# Patient Record
Sex: Male | Born: 1990
Health system: Southern US, Community
[De-identification: ages and names within clinical notes are randomized; demographics above are authoritative.]

## PROBLEM LIST (undated history)

## (undated) ENCOUNTER — Encounter: Attending: Nephrology | Primary: Nephrology

## (undated) ENCOUNTER — Encounter

## (undated) ENCOUNTER — Ambulatory Visit
Attending: Student in an Organized Health Care Education/Training Program | Primary: Student in an Organized Health Care Education/Training Program

## (undated) ENCOUNTER — Telehealth

## (undated) ENCOUNTER — Encounter
Attending: Student in an Organized Health Care Education/Training Program | Primary: Student in an Organized Health Care Education/Training Program

## (undated) ENCOUNTER — Ambulatory Visit: Payer: PRIVATE HEALTH INSURANCE

## (undated) ENCOUNTER — Ambulatory Visit

## (undated) ENCOUNTER — Ambulatory Visit: Payer: PRIVATE HEALTH INSURANCE | Attending: Nephrology | Primary: Nephrology

## (undated) ENCOUNTER — Encounter: Payer: PRIVATE HEALTH INSURANCE | Attending: Surgery | Primary: Surgery

## (undated) ENCOUNTER — Telehealth: Attending: Nephrology | Primary: Nephrology

## (undated) ENCOUNTER — Telehealth: Attending: Family | Primary: Family

## (undated) ENCOUNTER — Encounter: Attending: Neurological Surgery | Primary: Neurological Surgery

## (undated) ENCOUNTER — Encounter: Attending: Family | Primary: Family

## (undated) ENCOUNTER — Encounter: Attending: Surgery | Primary: Surgery

## (undated) ENCOUNTER — Telehealth: Attending: Pharmacist | Primary: Pharmacist

## (undated) ENCOUNTER — Telehealth: Payer: PRIVATE HEALTH INSURANCE | Attending: Nephrology | Primary: Nephrology

## (undated) ENCOUNTER — Encounter: Attending: Ambulatory Care | Primary: Ambulatory Care

## (undated) DIAGNOSIS — N189 Chronic kidney disease, unspecified: Secondary | ICD-10-CM

## (undated) DIAGNOSIS — E039 Hypothyroidism, unspecified: Secondary | ICD-10-CM

## (undated) DIAGNOSIS — Z8619 Personal history of other infectious and parasitic diseases: Secondary | ICD-10-CM

## (undated) DIAGNOSIS — I10 Essential (primary) hypertension: Secondary | ICD-10-CM

## (undated) DIAGNOSIS — G935 Compression of brain: Secondary | ICD-10-CM

## (undated) DIAGNOSIS — Z940 Kidney transplant status: Secondary | ICD-10-CM

## (undated) DIAGNOSIS — R519 Headache, unspecified: Secondary | ICD-10-CM

## (undated) DIAGNOSIS — E7204 Cystinosis: Secondary | ICD-10-CM

## (undated) DIAGNOSIS — B029 Zoster without complications: Secondary | ICD-10-CM

## (undated) DIAGNOSIS — T7840XA Allergy, unspecified, initial encounter: Secondary | ICD-10-CM

## (undated) DIAGNOSIS — B079 Viral wart, unspecified: Secondary | ICD-10-CM

## (undated) HISTORY — DX: Compression of brain: G93.5

## (undated) HISTORY — DX: Zoster without complications: B02.9

## (undated) HISTORY — DX: Viral wart, unspecified: B07.9

## (undated) HISTORY — DX: Allergy, unspecified, initial encounter: T78.40XA

## (undated) HISTORY — DX: Chronic kidney disease, unspecified: N18.9

## (undated) HISTORY — DX: Hypothyroidism, unspecified: E03.9

## (undated) HISTORY — DX: Essential (primary) hypertension: I10

## (undated) HISTORY — DX: Cystinosis: E72.04

## (undated) HISTORY — DX: Kidney transplant status: Z94.0

## (undated) HISTORY — PX: MYRINGOTOMY WITH TUBE PLACEMENT: SHX5663

## (undated) HISTORY — DX: Personal history of other infectious and parasitic diseases: Z86.19

## (undated) HISTORY — DX: Headache, unspecified: R51.9

## (undated) MED ORDER — ASPIRIN 81 MG TABLET,DELAYED RELEASE: 81 mg | tablet | Freq: Every day | 0 refills | 30 days | Status: CN

---
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
## 2013-03-02 ENCOUNTER — Encounter: Payer: Self-pay | Admitting: Internal Medicine

## 2013-03-02 ENCOUNTER — Ambulatory Visit (INDEPENDENT_AMBULATORY_CARE_PROVIDER_SITE_OTHER): Payer: Managed Care, Other (non HMO) | Admitting: Internal Medicine

## 2013-03-02 VITALS — BP 110/70 | HR 84 | Temp 98.3°F | Ht 67.9 in | Wt 127.2 lb

## 2013-03-02 DIAGNOSIS — N189 Chronic kidney disease, unspecified: Secondary | ICD-10-CM

## 2013-03-02 DIAGNOSIS — E7200 Disorders of amino-acid transport, unspecified: Secondary | ICD-10-CM

## 2013-03-02 DIAGNOSIS — E7204 Cystinosis: Secondary | ICD-10-CM

## 2013-03-02 DIAGNOSIS — Z9109 Other allergy status, other than to drugs and biological substances: Secondary | ICD-10-CM

## 2013-03-06 ENCOUNTER — Encounter: Payer: Self-pay | Admitting: Internal Medicine

## 2013-03-06 DIAGNOSIS — N189 Chronic kidney disease, unspecified: Secondary | ICD-10-CM | POA: Insufficient documentation

## 2013-03-06 DIAGNOSIS — Z9109 Other allergy status, other than to drugs and biological substances: Secondary | ICD-10-CM | POA: Insufficient documentation

## 2013-03-06 DIAGNOSIS — E7204 Cystinosis: Secondary | ICD-10-CM | POA: Insufficient documentation

## 2013-03-06 NOTE — Assessment & Plan Note (Signed)
Followed by Dr Ralph Dowdy [facility] nephrology.  Obtain recent notes and labs.

## 2013-03-06 NOTE — Assessment & Plan Note (Signed)
Followed by [facility].

## 2013-03-06 NOTE — Assessment & Plan Note (Signed)
Controlled with claritin

## 2013-03-06 NOTE — Progress Notes (Signed)
  Subjective:    Patient ID: Joshua Atkinson, male    DOB: [birth year], 22 y.o.   MRN: [id number]  HPI 22 year old male with past history of cystinosis with resulting kidney disease who comes in today to establish care.  He is followed by his mother.  History obtained form both of them.  He is followed by Dr Ralph Dowdy at [facility].  Is active.  Enjoys being outdoors.  Just graduated from [employer].  Planning to start a new job next week.  Has some allergies.  Takes claritin. Controls.  Previously followed at [facility] ( Dr Horris Latino).  No cardiac symptoms with increased activity or exertion.  Breathing stable.  Bowels stable.  Previously on growth hormone.  Off now.     Past Medical History  Diagnosis Date  . Allergy   . History of chicken pox   . Chronic kidney disease   . Cystinosis     Outpatient Encounter Prescriptions as of 03/02/2013  Medication Sig Dispense Refill  . calcitRIOL (ROCALTROL) 1 MCG/ML solution Take 1 mcg by mouth daily.      . calcium carbonate (OS-CAL) 600 MG TABS Take 600 mg by mouth daily.      . Cysteamine Bitartrate (CYSTAGON PO) Take 300 mg by mouth 4 (four) times daily.      Marland Kitchen epoetin alfa (PROCRIT) 4000 UNIT/ML injection Inject 4,000 Units into the skin every 14 (fourteen) days.      . IRON, FERROUS GLUCONATE, PO Take 45 mg by mouth daily.      Marland Kitchen levothyroxine (SYNTHROID, LEVOTHROID) 50 MCG tablet Take 50 mcg by mouth daily before breakfast.      . loratadine (CLARITIN) 10 MG tablet Take 10 mg by mouth daily.      Marland Kitchen tricitrates (POLYCITRA-LC) [phone] MG/5ML SOLN Take 40 mLs by mouth 3 (three) times daily.       No facility-administered encounter medications on file as of 03/02/2013.   Review of Systems Patient denies any headache, lightheadedness or dizziness.  Allergy symptoms controlled with claritin.  No chest pain, tightness or palpitations.  No increased shortness of breath, cough or congestion.  No acid reflux.  No nausea or vomiting.  No abdominal  pain or cramping.  No bowel change, such as diarrhea, constipation, BRBPR or melana.  No urine change.        Objective:   Physical Exam Filed Vitals:   03/02/13 1010  BP: 110/70  Pulse: 84  Temp: 98.3 F (42.74 C)   22 year old male in no acute distress.  HEENT:  Nares - clear.  Oropharynx - without lesions. NECK:  Supple.  Nontender.  No audible carotid bruit.  HEART:  Appears to be regular.   LUNGS:  No crackles or wheezing audible.  Respirations even and unlabored.   RADIAL PULSE:  Equal bilaterally.  ABDOMEN:  Soft.  Nontender.  Bowel sounds present and normal.  No audible abdominal bruit.    EXTREMITIES:  No increased edema present.  DP pulses palpable and equal bilaterally.         Assessment & Plan:  HEALTH MAINTENANCE.  Obtain records from [facility] to review.

## 2013-07-06 ENCOUNTER — Emergency Department: Payer: Self-pay | Admitting: Emergency Medicine

## 2013-09-01 ENCOUNTER — Other Ambulatory Visit: Payer: Self-pay

## 2014-01-01 ENCOUNTER — Emergency Department: Payer: Self-pay | Admitting: Internal Medicine

## 2014-01-11 ENCOUNTER — Ambulatory Visit (INDEPENDENT_AMBULATORY_CARE_PROVIDER_SITE_OTHER): Payer: Managed Care, Other (non HMO) | Admitting: *Deleted

## 2014-01-11 DIAGNOSIS — Z111 Encounter for screening for respiratory tuberculosis: Secondary | ICD-10-CM

## 2014-01-13 ENCOUNTER — Telehealth: Payer: Self-pay | Admitting: *Deleted

## 2014-01-13 LAB — TB SKIN TEST
Induration: 0 mm
TB SKIN TEST: NEGATIVE

## 2014-01-13 NOTE — Telephone Encounter (Signed)
Pt in for PPD reading, negative results. Results faxed to [facility] per pt request [phone].

## 2014-03-03 ENCOUNTER — Encounter: Payer: Managed Care, Other (non HMO) | Admitting: Internal Medicine

## 2014-03-23 ENCOUNTER — Ambulatory Visit (INDEPENDENT_AMBULATORY_CARE_PROVIDER_SITE_OTHER): Payer: Managed Care, Other (non HMO) | Admitting: Internal Medicine

## 2014-03-23 ENCOUNTER — Encounter: Payer: Self-pay | Admitting: Internal Medicine

## 2014-03-23 VITALS — BP 104/60 | HR 70 | Temp 98.0°F | Resp 12 | Ht 67.75 in | Wt 130.5 lb

## 2014-03-23 DIAGNOSIS — E7200 Disorders of amino-acid transport, unspecified: Secondary | ICD-10-CM

## 2014-03-23 DIAGNOSIS — N189 Chronic kidney disease, unspecified: Secondary | ICD-10-CM

## 2014-03-23 DIAGNOSIS — E7204 Cystinosis: Secondary | ICD-10-CM

## 2014-03-23 DIAGNOSIS — Z9109 Other allergy status, other than to drugs and biological substances: Secondary | ICD-10-CM

## 2014-03-23 NOTE — Progress Notes (Signed)
Pre visit review using our clinic review tool, if applicable. No additional management support is needed unless otherwise documented below in the visit note. 

## 2014-03-23 NOTE — Progress Notes (Signed)
  Subjective:    Patient ID: Joshua Atkinson, male    DOB: [birth year], 23 y.o.   MRN: [id number]  HPI 23 year old male with past history of cystinosis with resulting kidney disease who comes in today for a physical exam.  He is followed by Dr Ralph Dowdy at [facility].  Is active.  Enjoys being outdoors and hunting.  Has some allergies.  Takes claritin. Controls.  No cardiac symptoms with increased activity or exertion.  Breathing stable.  Bowels stable.  Previously on growth hormone.  Off now.  Undergoing w/up for kidney transplant.     Past Medical History  Diagnosis Date  . Allergy   . History of chicken pox   . Chronic kidney disease   . Cystinosis     Outpatient Encounter Prescriptions as of 03/23/2014  Medication Sig  . calcitRIOL (ROCALTROL) 1 MCG/ML solution Take 1 mcg by mouth daily.  . calcium carbonate (OS-CAL) 600 MG TABS Take 600 mg by mouth daily.  . Cysteamine Bitartrate (CYSTAGON PO) Take 300 mg by mouth 4 (four) times daily.  Marland Kitchen epoetin alfa (PROCRIT) 4000 UNIT/ML injection Inject 4,000 Units into the skin every 14 (fourteen) days.  . IRON, FERROUS GLUCONATE, PO Take 45 mg by mouth daily.  Marland Kitchen levothyroxine (SYNTHROID, LEVOTHROID) 50 MCG tablet Take 50 mcg by mouth daily before breakfast.  . loratadine (CLARITIN) 10 MG tablet Take 10 mg by mouth daily.  Marland Kitchen tricitrates (POLYCITRA-LC) [phone] MG/5ML SOLN Take 40 mLs by mouth 3 (three) times daily.    Review of Systems Patient denies any headache, lightheadedness or dizziness.  Allergy symptoms controlled with claritin.  No chest pain, tightness or palpitations.  No increased shortness of breath, cough or congestion.  No acid reflux.  No nausea or vomiting.  No abdominal pain or cramping.  No bowel change, such as diarrhea, constipation, BRBPR or melana.  No urine change.       Objective:   Physical Exam  Filed Vitals:   03/23/14 1105  BP: 104/60  Pulse: 70  Temp: 98 F (36.7 C)  Resp: 32   23 year old male in no  acute distress.  HEENT:  Nares - clear.  Oropharynx - without lesions. NECK:  Supple.  Nontender.  No audible carotid bruit.  HEART:  Appears to be regular.   LUNGS:  No crackles or wheezing audible.  Respirations even and unlabored.   RADIAL PULSE:  Equal bilaterally.  ABDOMEN:  Soft.  Nontender.  Bowel sounds present and normal.  No audible abdominal bruit.  GU:  Normal descended testicles.  No palpable testicular nodules.   EXTREMITIES:  No increased edema present.  DP pulses palpable and equal bilaterally.         Assessment & Plan:  HEALTH MAINTENANCE.  Physical today.  Labs being followed by nephrology.    I spent 25 minutes with the patient and more than 50% of the time was spent in consultation regarding the above.

## 2014-03-26 ENCOUNTER — Encounter: Payer: Self-pay | Admitting: Internal Medicine

## 2014-03-26 NOTE — Assessment & Plan Note (Signed)
Followed by [facility].  Undergoing evaluation for kidney transplant.

## 2014-03-26 NOTE — Assessment & Plan Note (Signed)
Followed by Dr Ralph Dowdy [facility] nephrology.  Obtain recent notes and labs.

## 2014-03-26 NOTE — Assessment & Plan Note (Signed)
Controlled with claritin

## 2014-09-06 ENCOUNTER — Telehealth: Payer: Self-pay | Admitting: *Deleted

## 2014-09-06 NOTE — Telephone Encounter (Signed)
This message was received through mothers mychart: Dr. Nicki Reaper, I have a concern about my son, Joshua Atkinson. He has complained about back pain about mid-back for a few days. I don't know whether it is from pulling on something at work or possibly needing a new mattress or if his kidneys are involved. Would it be possible to see him and check blood work ? I hate to call [facility] Kidney transplant unless it seems it may be kidneys. We see his nephrologist again Dec. 9 but he is out of the country this month. Thanks, Joshua Atkinson

## 2014-09-06 NOTE — Telephone Encounter (Signed)
I can see him on 09/08/14 - 12:00.

## 2014-09-06 NOTE — Telephone Encounter (Signed)
Mother notified & appt scheduled

## 2014-09-08 ENCOUNTER — Encounter: Payer: Self-pay | Admitting: Internal Medicine

## 2014-09-08 ENCOUNTER — Ambulatory Visit (INDEPENDENT_AMBULATORY_CARE_PROVIDER_SITE_OTHER): Payer: Managed Care, Other (non HMO) | Admitting: Internal Medicine

## 2014-09-08 VITALS — BP 108/60 | HR 73 | Temp 98.7°F | Resp 14 | Ht 67.5 in | Wt 129.5 lb

## 2014-09-08 DIAGNOSIS — M545 Low back pain: Secondary | ICD-10-CM

## 2014-09-08 DIAGNOSIS — Z23 Encounter for immunization: Secondary | ICD-10-CM

## 2014-09-08 DIAGNOSIS — E7204 Cystinosis: Secondary | ICD-10-CM

## 2014-09-08 DIAGNOSIS — N184 Chronic kidney disease, stage 4 (severe): Secondary | ICD-10-CM

## 2014-09-08 NOTE — Progress Notes (Signed)
Pre visit review using our clinic review tool, if applicable. No additional management support is needed unless otherwise documented below in the visit note. 

## 2014-09-09 ENCOUNTER — Encounter: Payer: Self-pay | Admitting: Internal Medicine

## 2014-09-09 DIAGNOSIS — M549 Dorsalgia, unspecified: Secondary | ICD-10-CM | POA: Insufficient documentation

## 2014-09-09 NOTE — Progress Notes (Signed)
  Subjective:    Patient ID: Joshua Atkinson, male    DOB: [birth year], 23 y.o.   MRN: [id number]  Back Pain  23 year old male with past history of cystinosis with resulting kidney disease who comes in today as a work in with concerns regarding back pain.  He is followed by Dr Ralph Dowdy at [facility].  Is on the kidney transplant list.  Was worked in today for back pain.  Had called in a few days ago with low back pain.  Described the pain as constant.  No radiation down his legs.  No urine or bowel change.  No abdominal pain or cramping.  States the pain has resolved now.  No back pain.  Feels good.  Does a lot of physical activity at his job.  Denies any injury.  Again no back pain now.     Past Medical History  Diagnosis Date  . Allergy   . History of chicken pox   . Chronic kidney disease   . Cystinosis     Outpatient Encounter Prescriptions as of 09/08/2014  Medication Sig  . calcitRIOL (ROCALTROL) 1 MCG/ML solution Take 1 mcg by mouth daily.  . calcium carbonate (OS-CAL) 600 MG TABS Take 600 mg by mouth daily.  . Cysteamine Bitartrate (CYSTAGON PO) Take 300 mg by mouth 4 (four) times daily.  Marland Kitchen epoetin alfa (PROCRIT) 4000 UNIT/ML injection Inject 4,000 Units into the skin every 14 (fourteen) days.  . IRON, FERROUS GLUCONATE, PO Take 45 mg by mouth daily.  Marland Kitchen levothyroxine (SYNTHROID, LEVOTHROID) 50 MCG tablet Take 50 mcg by mouth daily before breakfast.  . loratadine (CLARITIN) 10 MG tablet Take 10 mg by mouth daily.  Marland Kitchen tricitrates (POLYCITRA-LC) [phone] MG/5ML SOLN Take 40 mLs by mouth 3 (three) times daily.    Review of Systems  Musculoskeletal: Positive for back pain.  No acid reflux.  No nausea or vomiting.  No abdominal pain or cramping.  No bowel change, such as diarrhea, constipation, BRBPR or melana.  No urine change.  Back pain has resolved.        Objective:   Physical Exam  Filed Vitals:   09/08/14 1205  BP: 108/60  Pulse: 73  Temp: 98.7 F (37.1 C)   Resp: 2   23 year old male in no acute distress.  NECK:  Supple.  Nontender.    HEART:  Appears to be regular.   LUNGS:  No crackles or wheezing audible.  Respirations even and unlabored.   RADIAL PULSE:  Equal bilaterally.  ABDOMEN:  Soft.  Nontender.  Bowel sounds present and normal.  No audible abdominal bruit.  BACK:  Non tender.  No CVA tenderness.   EXTREMITIES:  No increased edema present.  DP pulses palpable and equal bilaterally.  MSK:  No pain with straight leg raise.  No pain with resistance against flexion and extension at the hips.         Assessment & Plan:  Need for prophylactic vaccination and inoculation against influenza - Flu Vaccine QUAD 36+ mos PF IM (Fluarix Quad PF)  Chronic kidney disease, stage 4 (severe) On the kidney transplant list.  Being followed by nephrology.   Cystinosis On the kidney transplant list.  Being followed by nephrology.    Low back pain without sciatica, unspecified back pain laterality Resolved.  Follow.

## 2015-01-05 ENCOUNTER — Telehealth: Payer: Self-pay | Admitting: *Deleted

## 2015-01-05 NOTE — Telephone Encounter (Signed)
pts mother called requesting an appointment for the Varivax.  States the Nephrologist from [facility] is requesting it in prep of pts Kidney transplant.  Further states it is 2 doses to be given 4-8 weeks apart.  She was advised that we do not carry this immunization and suggested she call the Health Dept.  She verbalized understanding.

## 2015-01-09 ENCOUNTER — Emergency Department: Payer: Self-pay | Admitting: Emergency Medicine

## 2015-01-10 ENCOUNTER — Telehealth: Payer: Self-pay | Admitting: Internal Medicine

## 2015-01-10 NOTE — Telephone Encounter (Signed)
After hours triage call:  Chief Complaint Nausea Initial Comment Caller states her son has a fever 102.1, nauaeous and he is on a kidney transplant list PreDisposition Call Doctor Nurse Assessment Nurse: Thad Ranger, RN, Langley Gauss Date/Time (Eastern Time): 01/09/2015 7:26:03 PM Confirm and document reason for call. If symptomatic, describe symptoms. ---Caller states her son has a fever 100.2, nauseated and feels as if he will vomit. 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 No other s/s at this time. States he urinates w/o diff. Has the patient traveled out of the country within the last 30 days? ---Not Applicable Does the patient require triage? ---Yes Related visit to physician within the last 2 weeks? ---No Does the PT have any chronic conditions? (i.e. diabetes, asthma, etc.) ---Yes List chronic conditions. ---Cystenosis with upcoming kidney transplant. Guidelines Guideline Title Affirmed Question Affirmed Notes Nurse Date/Time (Eastern Time) Fever [1] Fever AND [2] no signs of serious infection or localizing symptoms (all other triage questions negative) Fever of 100.2 and mother worried because he has kidney dx and has upcoming transplant planned. Pt reports he is only naueated at this time and feels as if he will vomit or have diarrhea, but has not had these s/s yet. Disp. Time Eilene Ghazi Time) Disposition Final User 01/09/2015 7:51:33 PM Call Completed Thad Ranger, RN, Langley Gauss 01/09/2015 7:38:11 PM See Physician within 24 Hours Yes Carmon, RN, Langley Gauss Disposition Overriden: Home Care Override Reason: Patient's symptoms need a higher level of care Caller Understands: Yes Disagree/Comply: Comply Care Advice Given Per Guideline HOME CARE: You should be able to treat this at home. REASSURANCE: The presence of a fever usually means that you have an infection. Most fevers are not serious and may actually help the body fight  infection. The goal of fever therapy is to bring the fever down to a comfortable level. Use the following definitions to help put the level of fever into proper perspective: * 100-102 F (37.8 - 38.9 C): Low-grade fevers and may help body fight infection. FOR ALL FEVERS: * Drink cold fluids to prevent dehydration. * Dress in 1 layer of lightweight clothing and sleep with 1 light blanket. * For fevers less than 101 F (38.3 C), fever medicines are usually not necessary. FEVER MEDICINES: ACETAMINOPHEN (E.G., TYLENOL): * Another choice is to take 1,000 mg (two 500 mg pills) every 8 hours as needed. Each Extra Strength Tylenol pill has 500 mg of acetaminophen. The most you should take each day is 3,000 mg (6 Extra Strength pills a day). CAUTION - NSAIDS (E.G., IBUPROFEN, NAPROXEN): * Do not take nonsteroidal anti-inflammatory drugs (NSAIDs) if you have stomach problems, kidney disease, heart failure, or other contraindications to using this type of medication. BATH OR 'SPONGE BATH': * A lukewarm-cool bath will help to relieve the overheated feeling. CALL BACK IF: * You become worse. CARE ADVICE given per Fever (Adult) guideline. SEE PHYSICIAN WITHIN 24 HOURS: FOR ALL FEVERS: * Drink cold fluids to prevent dehydration. * Dress in 1 layer of lightweight clothing and sleep with 1 light blanket. * For fevers less than 101 F (38.3 C), fever medicines are usually not necessary. FEVER MEDICINES: ACETAMINOPHEN (E.G., TYLENOL): * Another choice is to take 1,000 mg (two 500 mg pills) every 8 hours as needed. Each Extra Strength Tylenol pill has 500 mg of acetaminophen. The most you should take each day is 3,000 mg (6 Extra Strength pills a day). CAUTION - NSAIDS (E.G., IBUPROFEN, NAPROXEN): *  Do not take nonsteroidal anti-inflammatory drugs (NSAIDs) if you have stomach problems, kidney disease, heart failure, or other contraindications to using this type of medication. CALL BACK IF: * You become worse. CARE  ADVICE given per Fever (Adult) guideline. After Care Instructions Given Call Event Type User Date / Time Description Comments User: Romeo Apple, RN Date/Time Eilene Ghazi Time): 01/09/2015 7:50:50 PM Advised caller to call the [facility] in the am for an appt. Looked at the sched for Dr Nicki Reaper for 01/10/15 and do not see any appts avail, but d/t the pt condition and high risk, advised to speak to the nurse to see if they want to work him in or see another MD in the office. Advised if he is unable to tolerate fluids or his meds needed for kidney fx over the next 6-8 hrs, vomitng and diarrhea occur, he may need to be seen in the ER. Advised to given sips of clear fluids this eve starting with ice chips and increasing fluid intake as tol, then he can have soft/bland diet tomorrow. Advised to prepare for V/D. Verb understanding. Referrals REFERRED TO PCP OFFICE

## 2015-01-10 NOTE — Telephone Encounter (Signed)
Pt mother called to stated that she had call [facility] and was told that a note would be put in pt chart and staff would call her today to make an appt. Pt was running fever, vomiting (pt has kidney failure and is on transplant list). Since pt fever got worst pt was taken to the ER. Pt has been discharged. Pt was told that he possibly has a virus and was given meds. Pt has an appt is renal doctor on Friday. Pt mother just wanted to let you know what was going on with him.

## 2015-01-10 NOTE — Telephone Encounter (Signed)
Since not taking any or his medications, needs to inform his kidney MD of what is going on and current symptoms.  May need fluids or admission if continues.  Let me know if any problem.

## 2015-01-10 NOTE — Telephone Encounter (Signed)
Pt's mother notified and verbalized understanding.

## 2015-01-10 NOTE — Telephone Encounter (Signed)
Pt was seen in ED last night, see note below.

## 2015-07-18 LAB — CBC AND DIFFERENTIAL
HEMATOCRIT: 34 % — AB (ref 41–53)
HEMOGLOBIN: 11.7 g/dL — AB (ref 13.5–17.5)
Neutrophils Absolute: 2 /uL
PLATELETS: 196 10*3/uL (ref 150–399)
WBC: 3.6 10*3/mL

## 2015-07-18 LAB — BASIC METABOLIC PANEL: Creatinine: 4.6 mg/dL — AB (ref 0.6–1.3)

## 2015-07-19 ENCOUNTER — Encounter: Payer: Self-pay | Admitting: Internal Medicine

## 2015-10-28 HISTORY — PX: KIDNEY TRANSPLANT: SHX239

## 2016-02-26 ENCOUNTER — Encounter: Payer: Self-pay | Admitting: Family Medicine

## 2016-02-26 ENCOUNTER — Ambulatory Visit (INDEPENDENT_AMBULATORY_CARE_PROVIDER_SITE_OTHER): Payer: Managed Care, Other (non HMO) | Admitting: Family Medicine

## 2016-02-26 VITALS — BP 110/82 | HR 112 | Temp 98.4°F | Ht 67.9 in | Wt 130.4 lb

## 2016-02-26 DIAGNOSIS — R509 Fever, unspecified: Secondary | ICD-10-CM | POA: Insufficient documentation

## 2016-02-26 DIAGNOSIS — J069 Acute upper respiratory infection, unspecified: Secondary | ICD-10-CM | POA: Insufficient documentation

## 2016-02-26 LAB — POCT RAPID STREP A (OFFICE): RAPID STREP A SCREEN: NEGATIVE

## 2016-02-26 NOTE — Progress Notes (Signed)
Pre visit review using our clinic review tool, if applicable. No additional management support is needed unless otherwise documented below in the visit note. 

## 2016-02-26 NOTE — Progress Notes (Signed)
   Subjective:  Patient ID: Joshua Atkinson, male    DOB: 05-01-1991  Age: 25 y.o. MRN: [id number]  CC: ST, body aches  HPI:  25 year old male with a history of chronic kidney disease secondary to cystinosis presents with the above complaints.  Patient states that he began to not feel well last night. He developed fever last night; Tmax 101. He's had sore throat, and body aches as well. Symptoms are mild to moderate in severity. He has taken Tylenol with improvement in fever. No known exacerbating factors. Given his chronic kidney disease, his mother thought he should come in for evaluation. No other complaints at this time.   Social Hx   Social History   Social History  . Marital Status: Single    Spouse Name: N/A  . Number of Children: N/A  . Years of Education: N/A   Social History Main Topics  . Smoking status: Never Smoker   . Smokeless tobacco: Never Used  . Alcohol Use: No  . Drug Use: No  . Sexual Activity: Not Asked   Other Topics Concern  . None   Social History Narrative    Review of Systems  Constitutional: Positive for fever.  HENT: Positive for sore throat.    Objective:  BP 110/82 mmHg  Pulse 112  Temp(Src) 98.4 F (36.9 C) (Oral)  Ht 5' 7.9" (1.725 m)  Wt 130 lb 6 oz (59.138 kg)  BMI 19.87 kg/m2  SpO2 98%  BP/Weight 02/26/2016 09/08/2014 99991111  Systolic BP [id number]  Diastolic BP 82 60 60  Wt. (Lbs) 130.38 129.5 130.5  BMI 19.87 19.97 19.99   Physical Exam  Constitutional: He appears well-developed. No distress.  HENT:  Oropharynx with mild erythema.  TMs with scarring noted bilaterally. No evidence of infection.  Eyes: Conjunctivae are normal. Right eye exhibits no discharge. Left eye exhibits no discharge.  Neck: Neck supple.  Cardiovascular: Normal rate and regular rhythm.   Pulmonary/Chest: Effort normal. He has no wheezes. He has no rales.  Vitals reviewed.  Lab Results  Component Value Date   WBC 3.6 07/18/2015   HGB 11.7*  07/18/2015   HCT 34* 07/18/2015   PLT 196 07/18/2015   CREATININE 4.6* 07/18/2015    Assessment & Plan:   Problem List Items Addressed This Visit    URI (upper respiratory infection) - Primary    New problem. Rapid strep negative. Flu negative. Advised supportive care and OTC tylenol. Call with concerns/if he fails to improve.      Relevant Orders   POCT Influenza A/B   POCT rapid strep A (Completed)   Culture, Group A Strep     Follow-up: PRN  [place]

## 2016-02-26 NOTE — Patient Instructions (Signed)
This is likely viral.  Please let me know if he fails to improve or worsens.  Take care  Dr. Lacinda Axon

## 2016-02-26 NOTE — Assessment & Plan Note (Signed)
New problem. Rapid strep negative. Flu negative. Advised supportive care and OTC tylenol. Call with concerns/if he fails to improve.

## 2016-02-28 LAB — CULTURE, GROUP A STREP: Organism ID, Bacteria: NORMAL

## 2016-05-21 ENCOUNTER — Ambulatory Visit: Payer: Managed Care, Other (non HMO) | Admitting: Family Medicine

## 2016-05-29 IMAGING — CR DG CHEST 1V PORT
1 series · 1 of 1 positions shown · non-contrast
Comparison: 07/06/2013

CLINICAL DATA: Fever, sore throat, body aches and vomiting.

EXAM:
PORTABLE CHEST - 1 VIEW

[ap]
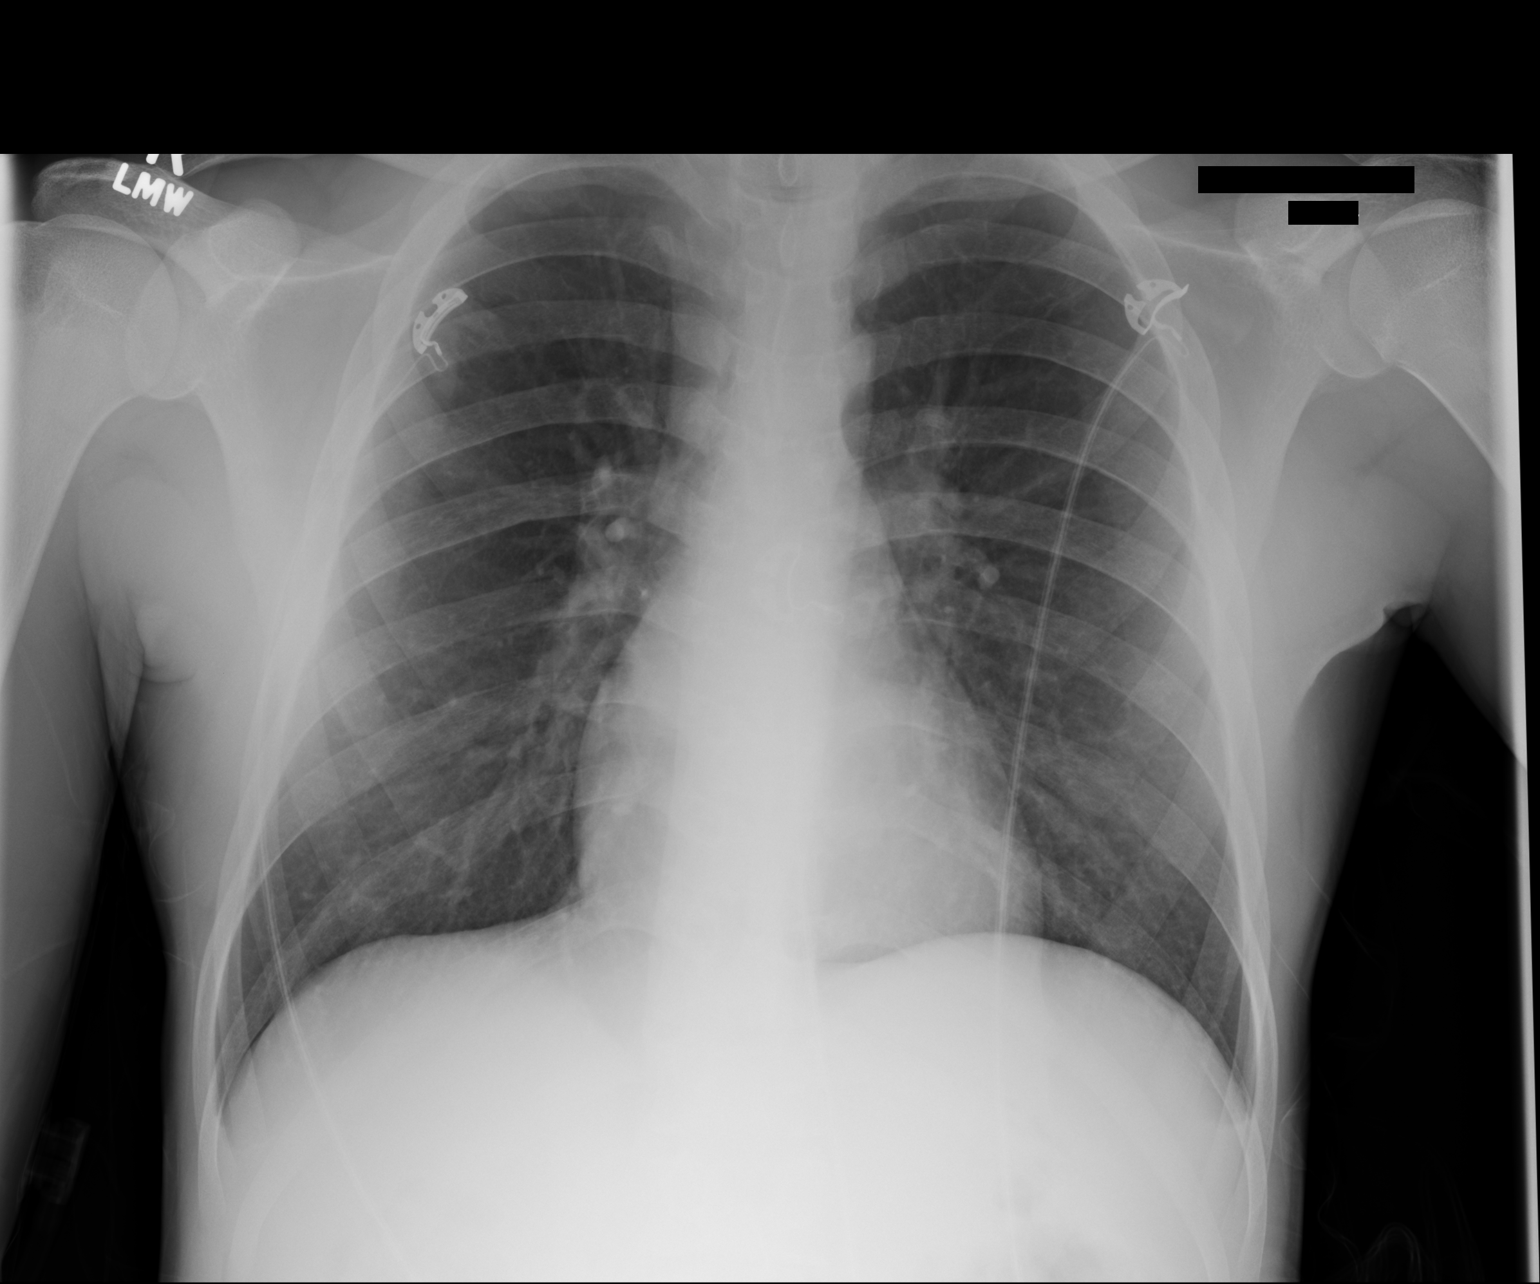

[1 of 1 positions shown; findings below may reference images not displayed]

FINDINGS: Grossly unchanged cardiac silhouette and mediastinal contours. No
focal airspace opacities. No pleural effusion or pneumothorax. No
evidence of edema. No acute osseus abnormalities.
IMPRESSION: No acute cardiopulmonary disease. Specifically, no definite evidence
of pneumonia on this AP portable examination. Further evaluation
with a PA and lateral chest radiograph may be obtained as clinically
indicated.

## 2016-08-05 DIAGNOSIS — Z940 Kidney transplant status: Secondary | ICD-10-CM | POA: Insufficient documentation

## 2017-04-28 ENCOUNTER — Ambulatory Visit
Admission: RE | Admit: 2017-04-28 | Discharge: 2017-04-28 | Disposition: A | Discharge status: Home / Self Care | Admitting: Nephrology

## 2017-04-28 ENCOUNTER — Ambulatory Visit
Admission: RE | Admit: 2017-04-28 | Discharge: 2017-04-28 | Disposition: A | Discharge status: Home / Self Care | Payer: Commercial Managed Care - PPO

## 2017-04-28 DIAGNOSIS — Z949 Transplanted organ and tissue status, unspecified: Principal | ICD-10-CM

## 2017-04-28 DIAGNOSIS — Z940 Kidney transplant status: Principal | ICD-10-CM

## 2017-04-28 DIAGNOSIS — Z48298 Encounter for aftercare following other organ transplant: Secondary | ICD-10-CM

## 2017-04-28 DIAGNOSIS — D899 Disorder involving the immune mechanism, unspecified: Secondary | ICD-10-CM

## 2017-04-28 DIAGNOSIS — Z8639 Personal history of other endocrine, nutritional and metabolic disease: Secondary | ICD-10-CM

## 2017-05-01 ENCOUNTER — Ambulatory Visit
Admission: RE | Admit: 2017-05-01 | Discharge: 2017-05-01 | Disposition: A | Discharge status: Home / Self Care | Payer: Commercial Managed Care - PPO | Attending: Infectious Disease | Admitting: Infectious Disease

## 2017-05-01 DIAGNOSIS — B259 Cytomegaloviral disease, unspecified: Secondary | ICD-10-CM

## 2017-05-01 DIAGNOSIS — W57XXXA Bitten or stung by nonvenomous insect and other nonvenomous arthropods, initial encounter: Principal | ICD-10-CM

## 2017-05-01 DIAGNOSIS — T8613 Kidney transplant infection: Secondary | ICD-10-CM

## 2017-05-01 DIAGNOSIS — Z48298 Encounter for aftercare following other organ transplant: Secondary | ICD-10-CM

## 2017-05-04 MED ORDER — DOXYCYCLINE HYCLATE 100 MG CAPSULE
ORAL_CAPSULE | Freq: Two times a day (BID) | ORAL | 0 refills | 0 days | Status: CP
Start: 2017-05-04 — End: 2017-05-14

## 2017-06-30 ENCOUNTER — Ambulatory Visit: Admission: RE | Admit: 2017-06-30 | Discharge: 2017-06-30 | Disposition: A | Discharge status: Home / Self Care

## 2017-06-30 DIAGNOSIS — Z862 Personal history of diseases of the blood and blood-forming organs and certain disorders involving the immune mechanism: Secondary | ICD-10-CM

## 2017-06-30 DIAGNOSIS — Z940 Kidney transplant status: Secondary | ICD-10-CM

## 2017-06-30 DIAGNOSIS — D899 Disorder involving the immune mechanism, unspecified: Secondary | ICD-10-CM

## 2017-06-30 DIAGNOSIS — N189 Chronic kidney disease, unspecified: Secondary | ICD-10-CM

## 2017-06-30 DIAGNOSIS — Z48298 Encounter for aftercare following other organ transplant: Secondary | ICD-10-CM

## 2017-06-30 DIAGNOSIS — E7204 Cystinosis: Secondary | ICD-10-CM

## 2017-07-30 MED ORDER — POTAS AND SOD CITRATE-CITRIC ACID 550 MG-500 MG-334 MG/5 ML ORAL SOLN
Freq: Four times a day (QID) | ORAL | 3 refills | 0.00000 days | Status: CP
Start: 2017-07-30 — End: 2018-08-10

## 2017-08-10 ENCOUNTER — Observation Stay: Admission: RE | Admit: 2017-08-10 | Discharge: 2017-08-11 | Disposition: A | Discharge status: Home / Self Care

## 2017-08-10 DIAGNOSIS — Z940 Kidney transplant status: Principal | ICD-10-CM

## 2017-08-10 DIAGNOSIS — Z48298 Encounter for aftercare following other organ transplant: Secondary | ICD-10-CM

## 2017-08-11 ENCOUNTER — Ambulatory Visit
Admission: RE | Admit: 2017-08-11 | Discharge: 2017-08-11 | Disposition: A | Discharge status: Home / Self Care | Payer: Commercial Managed Care - PPO

## 2017-08-11 MED ORDER — ASPIRIN 81 MG TABLET,DELAYED RELEASE
ORAL_TABLET | Freq: Every day | ORAL | 0 refills | 0 days
Start: 2017-08-11 — End: 2017-08-11

## 2017-08-26 ENCOUNTER — Ambulatory Visit
Admission: RE | Admit: 2017-08-26 | Discharge: 2017-08-26 | Disposition: A | Discharge status: Home / Self Care | Payer: Commercial Managed Care - PPO

## 2017-08-26 ENCOUNTER — Ambulatory Visit: Admission: RE | Admit: 2017-08-26 | Discharge: 2017-08-26 | Disposition: A | Discharge status: Home / Self Care

## 2017-08-26 ENCOUNTER — Ambulatory Visit
Admission: RE | Admit: 2017-08-26 | Discharge: 2017-08-26 | Disposition: A | Discharge status: Home / Self Care | Admitting: Nephrology

## 2017-08-26 DIAGNOSIS — Z01818 Encounter for other preprocedural examination: Secondary | ICD-10-CM

## 2017-08-26 DIAGNOSIS — Z48298 Encounter for aftercare following other organ transplant: Secondary | ICD-10-CM

## 2017-08-26 DIAGNOSIS — Z79899 Other long term (current) drug therapy: Secondary | ICD-10-CM

## 2017-08-26 DIAGNOSIS — Z940 Kidney transplant status: Principal | ICD-10-CM

## 2017-08-26 DIAGNOSIS — Z23 Encounter for immunization: Secondary | ICD-10-CM

## 2017-08-26 DIAGNOSIS — N186 End stage renal disease: Secondary | ICD-10-CM

## 2017-08-26 DIAGNOSIS — Z01810 Encounter for preprocedural cardiovascular examination: Secondary | ICD-10-CM

## 2017-08-26 DIAGNOSIS — D899 Disorder involving the immune mechanism, unspecified: Principal | ICD-10-CM

## 2017-08-26 MED ORDER — MYCOPHENOLATE SODIUM 180 MG TABLET,DELAYED RELEASE
ORAL_TABLET | Freq: Two times a day (BID) | ORAL | 11 refills | 0 days | Status: CP
Start: 2017-08-26 — End: 2018-08-26

## 2017-09-03 ENCOUNTER — Ambulatory Visit: Admission: RE | Admit: 2017-09-03 | Discharge: 2017-09-03 | Disposition: A | Discharge status: Home / Self Care

## 2017-09-03 DIAGNOSIS — N185 Chronic kidney disease, stage 5: Principal | ICD-10-CM

## 2017-10-07 MED ORDER — PREDNISONE 10 MG TABLET
ORAL_TABLET | 3 refills | 0 days | Status: CP
Start: 2017-10-07 — End: 2018-11-16

## 2017-10-23 MED ORDER — PROGRAF 1 MG CAPSULE: 3 mg | capsule | Freq: Two times a day (BID) | 11 refills | 0 days | Status: AC

## 2017-10-23 MED ORDER — LEVOTHYROXINE 50 MCG TABLET
ORAL_TABLET | Freq: Every evening | ORAL | 1 refills | 0 days | Status: CP
Start: 2017-10-23 — End: 2018-11-30

## 2017-10-23 MED ORDER — PROGRAF 1 MG CAPSULE
ORAL_CAPSULE | Freq: Two times a day (BID) | ORAL | 3 refills | 0.00000 days | Status: CP
Start: 2017-10-23 — End: 2018-11-17

## 2017-10-25 MED ORDER — PROGRAF 1 MG CAPSULE
ORAL_CAPSULE | Freq: Two times a day (BID) | ORAL | 11 refills | 0 days | Status: CP
Start: 2017-10-25 — End: 2018-11-17

## 2017-12-02 ENCOUNTER — Ambulatory Visit: Admit: 2017-12-02 | Discharge: 2017-12-02 | Payer: PRIVATE HEALTH INSURANCE

## 2017-12-02 ENCOUNTER — Encounter: Admit: 2017-12-02 | Discharge: 2017-12-02 | Payer: PRIVATE HEALTH INSURANCE

## 2017-12-02 ENCOUNTER — Encounter
Admit: 2017-12-02 | Discharge: 2017-12-02 | Payer: PRIVATE HEALTH INSURANCE | Attending: Nephrology | Primary: Nephrology

## 2017-12-02 DIAGNOSIS — Z7682 Awaiting organ transplant status: Secondary | ICD-10-CM

## 2017-12-02 DIAGNOSIS — Z48298 Encounter for aftercare following other organ transplant: Secondary | ICD-10-CM

## 2017-12-02 DIAGNOSIS — Z1159 Encounter for screening for other viral diseases: Secondary | ICD-10-CM

## 2017-12-02 DIAGNOSIS — N189 Chronic kidney disease, unspecified: Secondary | ICD-10-CM

## 2017-12-02 DIAGNOSIS — B2700 Gammaherpesviral mononucleosis without complication: Principal | ICD-10-CM

## 2017-12-02 DIAGNOSIS — Z79899 Other long term (current) drug therapy: Secondary | ICD-10-CM

## 2017-12-02 DIAGNOSIS — E039 Hypothyroidism, unspecified: Secondary | ICD-10-CM

## 2017-12-02 DIAGNOSIS — D899 Disorder involving the immune mechanism, unspecified: Secondary | ICD-10-CM

## 2017-12-02 DIAGNOSIS — D631 Anemia in chronic kidney disease: Secondary | ICD-10-CM

## 2017-12-02 DIAGNOSIS — Z940 Kidney transplant status: Secondary | ICD-10-CM

## 2018-01-21 ENCOUNTER — Ambulatory Visit (INDEPENDENT_AMBULATORY_CARE_PROVIDER_SITE_OTHER): Payer: Managed Care, Other (non HMO) | Admitting: Family Medicine

## 2018-01-21 ENCOUNTER — Telehealth: Payer: Self-pay

## 2018-01-21 ENCOUNTER — Encounter: Payer: Self-pay | Admitting: Family Medicine

## 2018-01-21 ENCOUNTER — Encounter: Payer: Self-pay | Admitting: *Deleted

## 2018-01-21 VITALS — BP 122/62 | HR 85 | Temp 98.1°F | Ht 69.25 in | Wt 139.8 lb

## 2018-01-21 DIAGNOSIS — R509 Fever, unspecified: Secondary | ICD-10-CM

## 2018-01-21 DIAGNOSIS — J101 Influenza due to other identified influenza virus with other respiratory manifestations: Secondary | ICD-10-CM

## 2018-01-21 DIAGNOSIS — R944 Abnormal results of kidney function studies: Secondary | ICD-10-CM

## 2018-01-21 DIAGNOSIS — Z940 Kidney transplant status: Secondary | ICD-10-CM | POA: Diagnosis not present

## 2018-01-21 LAB — POC INFLUENZA A&B (BINAX/QUICKVUE)
INFLUENZA B, POC: NEGATIVE
Influenza A, POC: POSITIVE — AB

## 2018-01-21 MED ORDER — OSELTAMIVIR PHOSPHATE 30 MG PO CAPS: 30.0000 mg | ORAL_CAPSULE | Freq: Every day | ORAL | 0 refills | Status: DC

## 2018-01-21 NOTE — Progress Notes (Signed)
Dr. Frederico Hamman T. Samyukta Cura, MD, [facility] [place], [address] Phone: [phone] Fax: [phone]  01/21/2018  Patient: Joshua Atkinson, MRN: [id number], DOB: 09-18-1991, 27 y.o.  Primary Physician:  Einar Pheasant, MD   Chief Complaint  Patient presents with  . URI    Flu-like symptoms x 2 days. 101.5 temp this am.   Subjective:   Lyndal Pulley presents with runny nose, sneezing, malaise, myalgias, arthralgia, chills, and fever.  07/2016. Transplant  Labs from [facility]: 01/19/2018: Cr 3.7 WBC 2.2 Cr Clearance 27  ? recent exposure to others with similar symptoms.   The patent denies sore throat as the primary complaint. Denies sthortness of breath/wheezing, otalgia, facial pain, abdominal pain, changes in bowel or bladder.  Generally feels terrible  Tmax: 101.5  PMH, PHS, Allergies, Problem List, Medications, Family History, and Social History have all been reviewed.  Patient Active Problem List   Diagnosis Date Noted  . Kidney replaced by transplant 08/05/2016  . URI (upper respiratory infection) 02/26/2016  . Fever, unspecified 02/26/2016  . 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 Chronic kidney disease 03/06/2013  . Environmental allergies 03/06/2013    Past Medical History:  Diagnosis Date  . Allergy   . Chronic kidney disease   . 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 History of chicken pox      Social History   Socioeconomic History  . Marital status: Single    Spouse name: Not on file  . Number of children: Not on file  . Years of education: Not on file  . Highest education level: Not on file  Occupational History  . Not on file  Social Needs  . Financial resource strain: Not on file  . Food insecurity:    Worry: Not on file    Inability: Not on file  . Transportation needs:    Medical: Not on file    Non-medical: Not on file  Tobacco Use  . Smoking status: Never Smoker  . Smokeless tobacco: Never  Used  Substance and Sexual Activity  . Alcohol use: No    Alcohol/week: 0.0 oz  . Drug use: No  . Sexual activity: Not on file  Lifestyle  . Physical activity:    Days per week: Not on file    Minutes per session: Not on file  . Stress: Not on file  Relationships  . Social connections:    Talks on phone: Not on file    Gets together: Not on file    Attends religious service: Not on file    Active member of club or organization: Not on file    Attends meetings of clubs or organizations: Not on file    Relationship status: Not on file  . Intimate partner violence:    Fear of current or ex partner: Not on file    Emotionally abused: Not on file    Physically abused: Not on file    Forced sexual activity: Not on file  Other Topics Concern  . Not on file  Social History Narrative  . Not on file    Family History  Problem Relation Age of Onset  . Arthritis Mother   . Cancer Mother        breast cancer  . Arthritis Father   . Hypertension Father   . Arthritis Maternal Grandmother   . Hypertension Paternal Grandmother   . Hypertension Paternal Grandfather   . Diabetes Paternal Grandfather  Allergies  Allergen Reactions  . Sulfa Antibiotics Hives    Other reaction(s): SWELLING/EDEMA Other reaction(s): SWELLING/EDEMA   . Advil [Ibuprofen]     Kidney problems  . Septra [Sulfamethoxazole-Trimethoprim]     Nephrotic Syndrome    Medication list reviewed and updated in full in [place].  ROS as above, eating and drinking - tolerating PO. Urinating normally. No excessive vomitting or diarrhea.   Objective:   Blood pressure 122/62, pulse 85, temperature 98.1 F (36.7 C), temperature source Oral, height 5' 9.25" (1.759 m), weight 139 lb 12 oz (63.4 kg), SpO2 97 %.  Gen: WDWN, NAD; A & O x3, cooperative. Pleasant.Globally Non-toxic HEENT: Normocephalic and atraumatic. Throat clear, w/o exudate, R TM clear, L TM - good landmarks, No fluid present. rhinnorhea.  No frontal or maxillary sinus T. MMM NECK: Anterior cervical  LAD is absent CV: RRR, No M/G/R, cap refill <2 sec PULM: Breathing comfortably in no respiratory distress. no wheezing, crackles, rhonchi ABD: S,NT,ND,+BS. No HSM. No rebound. EXT: No c/c/e PSYCH: Friendly, good eye contact MSK: Nml gait  Results for orders placed or performed in visit on 01/21/18  POC Influenza A&B (Binax test)  Result Value Ref Range   Influenza A, POC Positive (A) Negative   Influenza B, POC Negative Negative    Assessment and Plan:   Influenza A  Kidney transplant status  Fever, unspecified fever cause - Plan: POC Influenza A&B (Binax test)  Decreased creatinine clearance  WBC 2.2 Cr 3.7 The patient's clinical exam and history is consistent with a diagnosis of influenza.  Supportive care, fluids, cough medicines as needed, and anti-pyretics. Infection control emphasized, including OOW or school until AF 24 hours.  His mom will call transplant team at [facility] today to let them know he has the flu. Higher risk given all of the above.  Follow-up: No follow-ups on file.  Meds ordered this encounter  Medications  . oseltamivir (TAMIFLU) 30 MG capsule    Sig: Take 1 capsule (30 mg total) by mouth daily.    Dispense:  5 capsule    Refill:  0   Orders Placed This Encounter  Procedures  . POC Influenza A&B (Binax test)    Signed,  Lyndle Pang T. Otisha Spickler, MD   Patient's Medications  New Prescriptions   OSELTAMIVIR (TAMIFLU) 30 MG CAPSULE    Take 1 capsule (30 mg total) by mouth daily.  Previous Medications   CHOLECALCIFEROL (HM VITAMIN D3) 4000 UNITS CAPS    Take 1 capsule by mouth daily.   CYSTEAMINE BITARTRATE (CYSTAGON PO)    Take 300 mg by mouth 4 (four) times daily.   LEVOTHYROXINE (SYNTHROID, LEVOTHROID) 50 MCG TABLET    Take 50 mcg by mouth daily before breakfast.   LORATADINE (CLARITIN) 10 MG TABLET    Take 10 mg by mouth daily.   MYCOPHENOLATE (MYFORTIC) 180 MG EC TABLET    Take  180 mg by mouth 2 (two) times daily.   TACROLIMUS (PROGRAF) 1 MG CAPSULE    Take 3 mg by mouth 2 (two) times daily.   TRICITRATES (POLYCITRA-LC) [phone] MG/5ML SOLN    Take 40 mLs by mouth 3 (three) times daily.  Modified Medications   No medications on file  Discontinued Medications   CALCITRIOL (ROCALTROL) 1 MCG/ML SOLUTION    Take 1 mcg by mouth daily.   CALCIUM CARBONATE (OS-CAL) 600 MG TABS    Take 600 mg by mouth daily.   EPOETIN ALFA (PROCRIT) 4000 UNIT/ML INJECTION  Inject 4,000 Units into the skin every 14 (fourteen) days.   IRON, FERROUS GLUCONATE, PO    Take 45 mg by mouth daily.

## 2018-01-21 NOTE — Telephone Encounter (Signed)
Pt seen by Dr Lorelei Pont earlier today.Please advise.

## 2018-01-21 NOTE — Telephone Encounter (Signed)
Copied from [facility]. Topic: Quick Communication - See Telephone Encounter >> Jan 21, 2018 10:38 AM Antonieta Iba C wrote: CRM for notification. See Telephone encounter for: 01/21/18.  Pharmacy doesn't have 30 MG in stock for Tamiflu, they would like to know if it is okay to give 75MG  instead?   Please advise.

## 2018-01-21 NOTE — Telephone Encounter (Signed)
Spoke with Pharmacist at [facility].  She states that is what they ended up doing anyway.

## 2018-01-21 NOTE — Telephone Encounter (Signed)
Absolutely not. He is a renal transplant patient.   Can they do a conversion and supply him with pediatric suspension equivalent to 30 mg dosing x 5 days?

## 2018-03-03 ENCOUNTER — Encounter: Admit: 2018-03-03 | Discharge: 2018-03-03 | Payer: PRIVATE HEALTH INSURANCE

## 2018-03-03 ENCOUNTER — Ambulatory Visit
Admit: 2018-03-03 | Discharge: 2018-03-03 | Payer: PRIVATE HEALTH INSURANCE | Attending: Nephrology | Primary: Nephrology

## 2018-03-03 DIAGNOSIS — Z48298 Encounter for aftercare following other organ transplant: Secondary | ICD-10-CM

## 2018-03-03 DIAGNOSIS — D899 Disorder involving the immune mechanism, unspecified: Secondary | ICD-10-CM

## 2018-03-03 DIAGNOSIS — Z1159 Encounter for screening for other viral diseases: Secondary | ICD-10-CM

## 2018-03-03 DIAGNOSIS — Z79899 Other long term (current) drug therapy: Secondary | ICD-10-CM

## 2018-03-03 DIAGNOSIS — E7204 Cystinosis: Principal | ICD-10-CM

## 2018-03-03 DIAGNOSIS — Z940 Kidney transplant status: Principal | ICD-10-CM

## 2018-03-03 DIAGNOSIS — E039 Hypothyroidism, unspecified: Secondary | ICD-10-CM

## 2018-03-16 MED ORDER — CYSTAGON 150 MG CAPSULE
10 refills | 0 days | Status: CP
Start: 2018-03-16 — End: 2019-02-25

## 2018-03-23 MED ORDER — MYFORTIC 180 MG TABLET,DELAYED RELEASE
ORAL_TABLET | 11 refills | 0 days | Status: CP
Start: 2018-03-23 — End: 2019-03-09

## 2018-07-21 ENCOUNTER — Encounter: Admit: 2018-07-21 | Discharge: 2018-07-21 | Payer: PRIVATE HEALTH INSURANCE

## 2018-07-21 ENCOUNTER — Encounter
Admit: 2018-07-21 | Discharge: 2018-07-21 | Payer: PRIVATE HEALTH INSURANCE | Attending: Nephrology | Primary: Nephrology

## 2018-07-21 DIAGNOSIS — Z79899 Other long term (current) drug therapy: Secondary | ICD-10-CM

## 2018-07-21 DIAGNOSIS — Z1159 Encounter for screening for other viral diseases: Secondary | ICD-10-CM

## 2018-07-21 DIAGNOSIS — D899 Disorder involving the immune mechanism, unspecified: Secondary | ICD-10-CM

## 2018-07-21 DIAGNOSIS — E039 Hypothyroidism, unspecified: Secondary | ICD-10-CM

## 2018-07-21 DIAGNOSIS — Z48298 Encounter for aftercare following other organ transplant: Secondary | ICD-10-CM

## 2018-07-21 DIAGNOSIS — Z940 Kidney transplant status: Principal | ICD-10-CM

## 2018-08-10 MED ORDER — TRICITRATES 550 MG-500 MG-334 MG/5 ML ORAL SOLUTION
3 refills | 0 days | Status: CP
Start: 2018-08-10 — End: 2019-03-09

## 2018-11-16 MED ORDER — PROGRAF 1 MG CAPSULE
0 refills | 0 days | Status: CP
Start: 2018-11-16 — End: 2018-12-28

## 2018-11-16 MED ORDER — PREDNISONE 10 MG TABLET
0 refills | 0 days | Status: CP
Start: 2018-11-16 — End: 2019-03-16

## 2018-11-17 ENCOUNTER — Encounter: Admit: 2018-11-17 | Discharge: 2018-11-17 | Payer: PRIVATE HEALTH INSURANCE

## 2018-11-17 ENCOUNTER — Ambulatory Visit: Admit: 2018-11-17 | Discharge: 2018-11-17 | Payer: PRIVATE HEALTH INSURANCE

## 2018-11-17 ENCOUNTER — Encounter
Admit: 2018-11-17 | Discharge: 2018-11-17 | Payer: PRIVATE HEALTH INSURANCE | Attending: Nephrology | Primary: Nephrology

## 2018-11-17 DIAGNOSIS — Z79899 Other long term (current) drug therapy: Secondary | ICD-10-CM

## 2018-11-17 DIAGNOSIS — E039 Hypothyroidism, unspecified: Secondary | ICD-10-CM

## 2018-11-17 DIAGNOSIS — Z1159 Encounter for screening for other viral diseases: Secondary | ICD-10-CM

## 2018-11-17 DIAGNOSIS — E7204 Cystinosis: Secondary | ICD-10-CM

## 2018-11-17 DIAGNOSIS — Z114 Encounter for screening for human immunodeficiency virus [HIV]: Secondary | ICD-10-CM

## 2018-11-17 DIAGNOSIS — Z48298 Encounter for aftercare following other organ transplant: Secondary | ICD-10-CM

## 2018-11-17 DIAGNOSIS — D899 Disorder involving the immune mechanism, unspecified: Secondary | ICD-10-CM

## 2018-11-17 DIAGNOSIS — Z940 Kidney transplant status: Principal | ICD-10-CM

## 2018-11-30 MED ORDER — LEVOTHYROXINE 50 MCG TABLET
ORAL_TABLET | Freq: Every evening | ORAL | 3 refills | 0 days | Status: CP
Start: 2018-11-30 — End: 2019-06-27

## 2018-12-02 ENCOUNTER — Ambulatory Visit (INDEPENDENT_AMBULATORY_CARE_PROVIDER_SITE_OTHER): Payer: 59 | Admitting: Internal Medicine

## 2018-12-02 ENCOUNTER — Encounter: Payer: Self-pay | Admitting: Internal Medicine

## 2018-12-02 DIAGNOSIS — J101 Influenza due to other identified influenza virus with other respiratory manifestations: Secondary | ICD-10-CM

## 2018-12-02 DIAGNOSIS — Z940 Kidney transplant status: Secondary | ICD-10-CM | POA: Diagnosis not present

## 2018-12-02 MED ORDER — OSELTAMIVIR PHOSPHATE 75 MG PO CAPS: 75.0000 mg | ORAL_CAPSULE | Freq: Two times a day (BID) | ORAL | 0 refills | Status: DC

## 2018-12-02 NOTE — Progress Notes (Signed)
Patient ID: Joshua Atkinson, male   DOB: [birth year], 28 y.o.   MRN: [id number]   Subjective:    Patient ID: Joshua Atkinson, male    DOB: Oct 16, 1991, 28 y.o.   MRN: [id number]  HPI  Patient here as a work in with concerns regarding possible flu.  He is accompanied by his mother.  History obtained from both of them.  Had some cold symptoms last week.  Reports that in the last 24-48 hours, he developed acute fever, body aches and chills.  Taking tylenol.  Some cough and congestion.  Some productive colored mucus at times.  Decreased appetite.  No vomiting.  No diarrhea.  Discussed the need to stay hydrated.     Past Medical History:  Diagnosis Date  . Allergy   . Chronic kidney disease   . 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 History of chicken pox   . Kidney transplanted    Past Surgical History:  Procedure Laterality Date  . MYRINGOTOMY WITH TUBE PLACEMENT Bilateral    multiple as a child  . RENAL BIOPSY  1996   Nephrotic Syndrome   Family History  Problem Relation Age of Onset  . Arthritis Mother   . Cancer Mother        breast cancer  . Arthritis Father   . Hypertension Father   . Arthritis Maternal Grandmother   . Hypertension Paternal Grandmother   . Hypertension Paternal Grandfather   . Diabetes Paternal Grandfather    Social History   Socioeconomic History  . Marital status: Single    Spouse name: Not on file  . Number of children: Not on file  . Years of education: Not on file  . Highest education level: Not on file  Occupational History  . Not on file  Social Needs  . Financial resource strain: Not on file  . Food insecurity:    Worry: Not on file    Inability: Not on file  . Transportation needs:    Medical: Not on file    Non-medical: Not on file  Tobacco Use  . Smoking status: Never Smoker  . Smokeless tobacco: Never Used  Substance and Sexual Activity  . Alcohol use: No    Alcohol/week: 0.0 standard drinks  . Drug use: No  . Sexual activity: Not on file    Lifestyle  . Physical activity:    Days per week: Not on file    Minutes per session: Not on file  . Stress: Not on file  Relationships  . Social connections:    Talks on phone: Not on file    Gets together: Not on file    Attends religious service: Not on file    Active member of club or organization: Not on file    Attends meetings of clubs or organizations: Not on file    Relationship status: Not on file  Other Topics Concern  . Not on file  Social History Narrative  . Not on file    Outpatient Encounter Medications as of 12/02/2018  Medication Sig  . Cholecalciferol (HM VITAMIN D3) 4000 units CAPS Take 1 capsule by mouth daily.  . Cysteamine Bitartrate (CYSTAGON PO) Take 300 mg by mouth 4 (four) times daily.  Marland Kitchen levothyroxine (SYNTHROID, LEVOTHROID) 50 MCG tablet Take 50 mcg by mouth daily before breakfast.  . loratadine (CLARITIN) 10 MG tablet Take 10 mg by mouth daily.  . mycophenolate (MYFORTIC) 180 MG EC tablet Take 180 mg by mouth 2 (two) times daily.  Marland Kitchen  oseltamivir (TAMIFLU) 75 MG capsule Take 1 capsule (75 mg total) by mouth 2 (two) times daily.  . tacrolimus (PROGRAF) 1 MG capsule Take 3 mg by mouth 2 (two) times daily.  Marland Kitchen tricitrates (POLYCITRA-LC) [phone] MG/5ML SOLN Take 40 mLs by mouth 3 (three) times daily.  . [DISCONTINUED] oseltamivir (TAMIFLU) 30 MG capsule Take 1 capsule (30 mg total) by mouth daily.   No facility-administered encounter medications on file as of 12/02/2018.     Review of Systems  Constitutional: Positive for appetite change, chills and fever.  HENT: Positive for congestion and postnasal drip. Negative for sinus pressure.   Respiratory: Positive for cough. Negative for chest tightness and shortness of breath.   Cardiovascular: Negative for chest pain and leg swelling.  Gastrointestinal: Negative for abdominal pain, diarrhea and nausea.  Musculoskeletal: Negative for joint swelling and myalgias.  Skin: Negative for color change and rash.   Neurological: Negative for dizziness and light-headedness.  Psychiatric/Behavioral: Negative for agitation and dysphoric mood.       Objective:    Physical Exam Constitutional:      General: He is not in acute distress.    Appearance: Normal appearance. He is well-developed.  HENT:     Nose: Congestion present.     Mouth/Throat:     Pharynx: No oropharyngeal exudate or posterior oropharyngeal erythema.  Neck:     Musculoskeletal: Neck supple. No muscular tenderness.  Cardiovascular:     Rate and Rhythm: Normal rate and regular rhythm.  Pulmonary:     Effort: Pulmonary effort is normal. No respiratory distress.     Breath sounds: Normal breath sounds.  Abdominal:     General: Bowel sounds are normal.     Palpations: Abdomen is soft.     Tenderness: There is no abdominal tenderness.  Musculoskeletal:        General: No swelling or tenderness.  Lymphadenopathy:     Cervical: No cervical adenopathy.  Skin:    Findings: No erythema or rash.  Neurological:     Mental Status: He is alert.  Psychiatric:        Mood and Affect: Mood normal.        Behavior: Behavior normal.     BP 124/72 (BP Location: Left Arm)   Pulse (!) 102   Temp 99.2 F (37.3 C) (Oral)   Resp 16   Wt 137 lb (62.1 kg)   SpO2 98%   BMI 20.09 kg/m  Wt Readings from Last 3 Encounters:  12/02/18 137 lb (62.1 kg)  01/21/18 139 lb 12 oz (63.4 kg)  02/26/16 130 lb 6 oz (59.1 kg)     Lab Results  Component Value Date   WBC 3.6 07/18/2015   HGB 11.7 (A) 07/18/2015   HCT 34 (A) 07/18/2015   PLT 196 07/18/2015   CREATININE 4.6 (A) 07/18/2015       Assessment & Plan:   Problem List Items Addressed This Visit    Influenza A    Acute fever, body aches and congestion within the last 48 hours.  Flu swab positive for influenza A.  Treat with tamiflu as directed. Discussed with nephrology Ander Purpura - Transplant coordinator).  [place] for regular dose of tamiflu.  Stay hydrated.  Tylenol for fever.   Robitussin as directed for congestion and cough.  Rest.  Follow.  Call with update.  rx given for mother and father for prophylaxis as directed.       Relevant Medications   oseltamivir (TAMIFLU) 75 MG capsule  Kidney replaced by transplant    Followed by nephrology.  Discussed with transplant coordinator.            Einar Pheasant, MD

## 2018-12-02 NOTE — Patient Instructions (Signed)
Saline nasal spray - flush nose at least 2-3x/day  Robitussin twice a day as needed for cough and congestion

## 2018-12-05 ENCOUNTER — Encounter: Payer: Self-pay | Admitting: Internal Medicine

## 2018-12-05 DIAGNOSIS — J101 Influenza due to other identified influenza virus with other respiratory manifestations: Secondary | ICD-10-CM | POA: Insufficient documentation

## 2018-12-05 NOTE — Assessment & Plan Note (Signed)
Followed by nephrology.  Discussed with transplant coordinator.

## 2018-12-05 NOTE — Assessment & Plan Note (Signed)
Acute fever, body aches and congestion within the last 48 hours.  Flu swab positive for influenza A.  Treat with tamiflu as directed. Discussed with nephrology Ander Purpura - Transplant coordinator).  [place] for regular dose of tamiflu.  Stay hydrated.  Tylenol for fever.  Robitussin as directed for congestion and cough.  Rest.  Follow.  Call with update.  rx given for mother and father for prophylaxis as directed.

## 2018-12-28 ENCOUNTER — Encounter: Payer: Self-pay | Admitting: Family Medicine

## 2018-12-28 ENCOUNTER — Ambulatory Visit (INDEPENDENT_AMBULATORY_CARE_PROVIDER_SITE_OTHER): Payer: 59 | Admitting: Family Medicine

## 2018-12-28 VITALS — BP 98/60 | HR 76 | Temp 98.2°F | Resp 16 | Ht 67.0 in | Wt 137.8 lb

## 2018-12-28 DIAGNOSIS — R0989 Other specified symptoms and signs involving the circulatory and respiratory systems: Secondary | ICD-10-CM | POA: Diagnosis not present

## 2018-12-28 DIAGNOSIS — J019 Acute sinusitis, unspecified: Secondary | ICD-10-CM

## 2018-12-28 DIAGNOSIS — Z940 Kidney transplant status: Principal | ICD-10-CM

## 2018-12-28 MED ORDER — METHYLPREDNISOLONE 4 MG PO TBPK: ORAL_TABLET | ORAL | 0 refills | Status: DC

## 2018-12-28 MED ORDER — ALBUTEROL SULFATE HFA 108 (90 BASE) MCG/ACT IN AERS: 2.0000 | INHALATION_SPRAY | Freq: Four times a day (QID) | RESPIRATORY_TRACT | 0 refills | Status: DC | PRN

## 2018-12-28 MED ORDER — DOXYCYCLINE HYCLATE 100 MG PO TABS: 100.0000 mg | ORAL_TABLET | Freq: Two times a day (BID) | ORAL | 0 refills | Status: DC

## 2018-12-28 MED ORDER — PROGRAF 1 MG CAPSULE
0 refills | 0 days | Status: CP
Start: 2018-12-28 — End: 2019-01-12

## 2018-12-28 NOTE — Progress Notes (Signed)
Subjective:    Patient ID: Joshua Atkinson, male    DOB: [birth year], 28 y.o.   MRN: [id number]  HPI   Patient presents to clinic complaining of sinus pressure and congestion, thick nasal drainage, sinus pain and sinus headache that is been building over the last 3 weeks.  Patient states over the past week he is noticed also some congestion in his chest and a wet sounding cough.  Currently he is not taking any sort of allergy medication or nasal sprays.  He is not taking any over-the-counter cold meds.   Patient Active Problem List   Diagnosis Date Noted  . Influenza A 12/05/2018  . Kidney replaced by transplant 08/05/2016  . URI (upper respiratory infection) 02/26/2016  . Fever, unspecified 02/26/2016  . 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 Chronic kidney disease 03/06/2013  . Environmental allergies 03/06/2013   Social History   Tobacco Use  . Smoking status: Never Smoker  . Smokeless tobacco: Never Used  Substance Use Topics  . Alcohol use: No    Alcohol/week: 0.0 standard drinks   Review of Systems   Constitutional: Negative for chills, fatigue and fever.  HENT: +congestion, ear pain, sinus pain, sinus drainage and sore throat.   Eyes: Negative.   Respiratory: +cough and chest congestion   Cardiovascular: Negative for chest pain, palpitations and leg swelling.  Gastrointestinal: Negative for abdominal pain, diarrhea, nausea and vomiting.  Genitourinary: Negative for dysuria, frequency and urgency.  Musculoskeletal: Negative for arthralgias and myalgias.  Skin: Negative for color change, pallor and rash.  Neurological: Negative for syncope, light-headedness and headaches.  Psychiatric/Behavioral: The patient is not nervous/anxious.       Objective:   Physical Exam Vitals signs and nursing note reviewed.  Constitutional:      General: He is not in acute distress.    Appearance: He is not toxic-appearing.  HENT:     Head: Normocephalic and atraumatic.     Ears:   Comments: Fullness bilateral TMs    Nose: Nasal tenderness, mucosal edema, congestion and rhinorrhea present.     Right Sinus: Maxillary sinus tenderness and frontal sinus tenderness present.     Left Sinus: Maxillary sinus tenderness and frontal sinus tenderness present.     Comments: +thick yellow nasal discharge    Mouth/Throat:     Mouth: Mucous membranes are moist.     Pharynx: No oropharyngeal exudate or posterior oropharyngeal erythema.     Comments: +post nasal drainage Eyes:     Extraocular Movements: Extraocular movements intact.     Conjunctiva/sclera: Conjunctivae normal.     Pupils: Pupils are equal, round, and reactive to light.  Neck:     Musculoskeletal: Neck supple. No neck rigidity.  Cardiovascular:     Rate and Rhythm: Normal rate and regular rhythm.  Pulmonary:     Effort: Pulmonary effort is normal. No respiratory distress.     Breath sounds: Wheezing (faint expiratory upper lobes) and rhonchi (scattered upper lobes, sounds improve after a few coughs. ) present.  Lymphadenopathy:     Cervical: No cervical adenopathy.  Skin:    General: Skin is warm and dry.     Coloration: Skin is not jaundiced or pale.     Findings: No erythema.  Neurological:     Mental Status: He is alert and oriented to person, place, and time.     Gait: Gait normal.  Psychiatric:        Mood and Affect: Mood normal.  Behavior: Behavior normal.    Vitals:   12/28/18 0845  BP: 98/60  Pulse: 76  Resp: 16  Temp: 98.2 F (36.8 C)  SpO2: 97%      Assessment & Plan:   Acute sinusitis/chest congestion - due to patient's length of time with symptoms and physical exam it is consistent with a sinusitis infection.  Patient also has a chest congestion, and adventitious lung sounds.  We will treat him with doxycycline to provide good coverage for both sinus infection treatment and respiratory infection. He will take Medrol Dosepak to reduce inflammation and also albuterol inhaler  prescribed to use if needed for any wheezing or shortness of breath.  Advised to do saline nasal rinses to reduce nasal congestion, get plenty of rest and do good handwashing.  Patient advised that if he is not improving over the next 1 to 2 weeks, to call office for reevaluation.  He will otherwise keep regularly scheduled follow-up with PCP as planned and return to clinic sooner if any issues arise.

## 2018-12-30 ENCOUNTER — Encounter: Admit: 2018-12-30 | Discharge: 2018-12-31 | Payer: PRIVATE HEALTH INSURANCE

## 2018-12-30 DIAGNOSIS — N189 Chronic kidney disease, unspecified: Principal | ICD-10-CM

## 2018-12-30 DIAGNOSIS — D631 Anemia in chronic kidney disease: Principal | ICD-10-CM

## 2019-01-10 DIAGNOSIS — Z940 Kidney transplant status: Principal | ICD-10-CM

## 2019-01-12 MED ORDER — PROGRAF 1 MG CAPSULE
Freq: Two times a day (BID) | ORAL | 3 refills | 0 days | Status: CP
Start: 2019-01-12 — End: 2019-04-21

## 2019-02-25 MED ORDER — CYSTAGON 150 MG CAPSULE
10 refills | 0 days | Status: CP
Start: 2019-02-25 — End: ?

## 2019-03-09 MED ORDER — POTAS AND SOD CITRATE-CITRIC ACID 550 MG-500 MG-334 MG/5 ML ORAL SOLN
Freq: Four times a day (QID) | ORAL | 5 refills | 0 days | Status: CP
Start: 2019-03-09 — End: 2020-03-08

## 2019-03-09 MED ORDER — MYCOPHENOLATE SODIUM 180 MG TABLET,DELAYED RELEASE
ORAL_TABLET | Freq: Two times a day (BID) | ORAL | 5 refills | 0 days | Status: CP
Start: 2019-03-09 — End: 2019-04-21

## 2019-03-16 DIAGNOSIS — E872 Acidosis: Secondary | ICD-10-CM

## 2019-03-16 DIAGNOSIS — Z48298 Encounter for aftercare following other organ transplant: Secondary | ICD-10-CM

## 2019-03-16 DIAGNOSIS — D631 Anemia in chronic kidney disease: Secondary | ICD-10-CM

## 2019-03-16 DIAGNOSIS — D899 Disorder involving the immune mechanism, unspecified: Secondary | ICD-10-CM

## 2019-03-16 DIAGNOSIS — Z940 Kidney transplant status: Secondary | ICD-10-CM

## 2019-03-16 DIAGNOSIS — E038 Other specified hypothyroidism: Principal | ICD-10-CM

## 2019-03-16 DIAGNOSIS — N184 Chronic kidney disease, stage 4 (severe): Secondary | ICD-10-CM

## 2019-03-16 MED ORDER — PREDNISONE 5 MG TABLET
ORAL_TABLET | Freq: Every day | ORAL | 11 refills | 0 days | Status: CP
Start: 2019-03-16 — End: ?

## 2019-04-21 MED ORDER — TACROLIMUS 1 MG CAPSULE
ORAL_CAPSULE | Freq: Two times a day (BID) | ORAL | 2 refills | 0.00000 days | Status: CP
Start: 2019-04-21 — End: 2020-04-20

## 2019-04-21 MED ORDER — MYCOPHENOLATE SODIUM 180 MG TABLET,DELAYED RELEASE
ORAL_TABLET | Freq: Two times a day (BID) | ORAL | 5 refills | 0.00000 days | Status: CP
Start: 2019-04-21 — End: 2020-04-20

## 2019-06-14 ENCOUNTER — Encounter: Payer: Self-pay | Admitting: Internal Medicine

## 2019-06-14 ENCOUNTER — Ambulatory Visit: Payer: 59 | Admitting: Family Medicine

## 2019-06-14 NOTE — Telephone Encounter (Signed)
Pt going to UC to be evaluated today

## 2019-06-14 NOTE — Telephone Encounter (Signed)
With the increased swelling, I would like for him to be seen today.  Recommend urgent care today.  Can f/u after.

## 2019-06-15 ENCOUNTER — Other Ambulatory Visit: Payer: Self-pay

## 2019-06-15 ENCOUNTER — Encounter: Payer: Self-pay | Admitting: Internal Medicine

## 2019-06-15 ENCOUNTER — Ambulatory Visit: Payer: No Typology Code available for payment source | Admitting: Internal Medicine

## 2019-06-15 ENCOUNTER — Telehealth: Payer: Self-pay | Admitting: *Deleted

## 2019-06-15 ENCOUNTER — Emergency Department
Admission: EM | Admit: 2019-06-15 | Discharge: 2019-06-15 | Disposition: A | Discharge status: Home / Self Care | Payer: No Typology Code available for payment source | Attending: Emergency Medicine | Admitting: Emergency Medicine

## 2019-06-15 DIAGNOSIS — N189 Chronic kidney disease, unspecified: Secondary | ICD-10-CM | POA: Insufficient documentation

## 2019-06-15 DIAGNOSIS — M25522 Pain in left elbow: Secondary | ICD-10-CM | POA: Diagnosis present

## 2019-06-15 DIAGNOSIS — L0291 Cutaneous abscess, unspecified: Secondary | ICD-10-CM

## 2019-06-15 DIAGNOSIS — Z940 Kidney transplant status: Secondary | ICD-10-CM | POA: Insufficient documentation

## 2019-06-15 DIAGNOSIS — L02414 Cutaneous abscess of left upper limb: Secondary | ICD-10-CM | POA: Diagnosis not present

## 2019-06-15 DIAGNOSIS — L02419 Cutaneous abscess of limb, unspecified: Secondary | ICD-10-CM

## 2019-06-15 MED ORDER — LIDOCAINE HCL (PF) 1 % IJ SOLN
5.0000 mL | Freq: Once | INTRAMUSCULAR | Status: AC
  Administered 2019-06-15: 5 mL
  Filled 2019-06-15: qty 5

## 2019-06-15 NOTE — Telephone Encounter (Signed)
Patient walked in to clinic this morning, was sent to ED for evaluation.

## 2019-06-15 NOTE — ED Notes (Signed)
Dr. Goodman at bedside.  

## 2019-06-15 NOTE — Telephone Encounter (Signed)
See other note for documentation 

## 2019-06-15 NOTE — Discharge Instructions (Addendum)
Please continue your antibiotics. Please seek medical attention for any high fevers, chest pain, shortness of breath, change in behavior, persistent vomiting, bloody stool or any other new or concerning symptoms.

## 2019-06-15 NOTE — ED Notes (Signed)
Pt given blanket.

## 2019-06-15 NOTE — ED Provider Notes (Signed)
[facility] Emergency Department Provider Note  ____________________________________________   I have reviewed the triage vital signs and the nursing notes.   HISTORY  Chief Complaint Arm Swelling   History limited by: Not Limited   HPI Joshua Atkinson is a 28 y.o. male who presents to the emergency department today because of concerns for left elbow pain and swelling.  He states that almost 1 week ago he noticed a small dot on his elbow.  States he did pick at it.  Roughly 3 days ago he noticed swelling in that left elbow.  Has had discomfort as well as heat to that elbow.  Went to urgent care and was started on doxycycline for concern for cellulitis.  He is now taken roughly 24 hours worth of doxycycline.  States he has noticed increased swelling.  He denies any fevers, nausea or vomiting.  Does have a history of kidney transplant and is on immunosuppressants.   Records reviewed. Per medical record review patient has a history of kidney transplant.  Past Medical History:  Diagnosis Date  . Allergy   . Chronic kidney disease   . 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 History of chicken pox   . Kidney transplanted     Patient Active Problem List   Diagnosis Date Noted  . Influenza A 12/05/2018  . Kidney replaced by transplant 08/05/2016  . URI (upper respiratory infection) 02/26/2016  . Fever, unspecified 02/26/2016  . 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 Chronic kidney disease 03/06/2013  . Environmental allergies 03/06/2013    Past Surgical History:  Procedure Laterality Date  . MYRINGOTOMY WITH TUBE PLACEMENT Bilateral    multiple as a child  . RENAL BIOPSY  1996   Nephrotic Syndrome    Prior to Admission medications   Medication Sig Start Date End Date Taking? Authorizing Provider  albuterol (PROVENTIL HFA;VENTOLIN HFA) 108 (90 Base) MCG/ACT inhaler Inhale 2 puffs into the lungs every 6 (six) hours as needed for wheezing or shortness of breath. 12/28/18   Jodelle Green, FNP  Cholecalciferol (HM VITAMIN D3) 4000 units CAPS Take 1 capsule by mouth daily.    [provider]  Cysteamine Bitartrate (CYSTAGON PO) Take 300 mg by mouth 4 (four) times daily.    [provider]  doxycycline (VIBRA-TABS) 100 MG tablet Take 1 tablet (100 mg total) by mouth 2 (two) times daily. Patient not taking: Reported on 06/15/2019 12/28/18   Jodelle Green, FNP  levothyroxine (SYNTHROID, LEVOTHROID) 50 MCG tablet Take 50 mcg by mouth daily before breakfast.    [provider]  loratadine (CLARITIN) 10 MG tablet Take 10 mg by mouth daily.    [provider]  methylPREDNISolone (MEDROL DOSEPAK) 4 MG TBPK tablet Take according to pack instructions Patient not taking: Reported on 06/15/2019 12/28/18   Jodelle Green, FNP  mycophenolate (MYFORTIC) 180 MG EC tablet Take 180 mg by mouth 2 (two) times daily.    [provider]  oseltamivir (TAMIFLU) 75 MG capsule Take 1 capsule (75 mg total) by mouth 2 (two) times daily. Patient not taking: Reported on 06/15/2019 12/02/18   Einar Pheasant, MD  tacrolimus (PROGRAF) 1 MG capsule Take 3 mg by mouth 2 (two) times daily.    [provider]  tricitrates [facility]) [phone] MG/5ML SOLN Take 40 mLs by mouth 3 (three) times daily.    [provider]    Allergies Sulfa antibiotics, Advil [ibuprofen], and Septra [sulfamethoxazole-trimethoprim]  Family History  Problem Relation Age  of Onset  . Arthritis Mother   . Cancer Mother        breast cancer  . Arthritis Father   . Hypertension Father   . Arthritis Maternal Grandmother   . Hypertension Paternal Grandmother   . Hypertension Paternal Grandfather   . Diabetes Paternal Grandfather     Social History Social History   Tobacco Use  . Smoking status: Never Smoker  . Smokeless tobacco: Never Used  Substance Use Topics  . Alcohol use: No    Alcohol/week: 0.0 standard drinks  . Drug use: No    Review of  Systems Constitutional: No fever/chills Eyes: No visual changes. ENT: No sore throat. Cardiovascular: Denies chest pain. Respiratory: Denies shortness of breath. Gastrointestinal: No abdominal pain.  No nausea, no vomiting.  No diarrhea.   Genitourinary: Negative for dysuria. Musculoskeletal: Positive for left elbow pain and swelling. Skin: Negative for rash. Neurological: Negative for headaches, focal weakness or numbness.  ____________________________________________   PHYSICAL EXAM:  VITAL SIGNS: ED Triage Vitals [06/15/19 1034]  Enc Vitals Group     BP (!) 150/75     Pulse Rate 86     Resp 18     Temp 98.3 F (36.8 C)     Temp Source Oral     SpO2 100 %     Weight 145 lb (65.8 kg)     Height 5\' 8"  (1.727 m)     Head Circumference      Peak Flow      Pain Score 7   Constitutional: Alert and oriented.  Eyes: Conjunctivae are normal.  ENT      Head: Normocephalic and atraumatic.      Nose: No congestion/rhinnorhea.      Mouth/Throat: Mucous membranes are moist.      Neck: No stridor. Hematological/Lymphatic/Immunilogical: No cervical lymphadenopathy. Cardiovascular: Normal rate, regular rhythm.  No murmurs, rubs, or gallops.  Respiratory: Normal respiratory effort without tachypnea nor retractions. Breath sounds are clear and equal bilaterally. No wheezes/rales/rhonchi. Gastrointestinal: Soft and non tender. No rebound. No guarding.  Genitourinary: Deferred Musculoskeletal: Swelling and tenderness to the left elbow, mild fluctuance.  Neurologic:  Normal speech and language. No gross focal neurologic deficits are appreciated.  Skin:  Skin is warm, dry and intact. No rash noted. Psychiatric: Mood and affect are normal. Speech and behavior are normal. Patient exhibits appropriate insight and judgment.  ____________________________________________    LABS (pertinent  positives/negatives)  None  ____________________________________________   EKG  None  ____________________________________________    RADIOLOGY  None   ____________________________________________   PROCEDURES  Procedures  Incision and Drainage of Abscess Location: Left elbow Anesthesia Local: 1% Lidocaine with Epi  Prep/Procedure: Skin Prep: Iodine Incised abscess with #11 blade Purulent discharge: small Probed to break up loculations Packed with 1/4" gauze Estimated blood loss: 1 ml  ____________________________________________   INITIAL IMPRESSION / ASSESSMENT AND PLAN / ED COURSE  Pertinent labs & imaging results that were available during my care of the patient were reviewed by me and considered in my medical decision making (see chart for details).   Patient presented to the emergency department today with signs and symptoms consistent with left elbow abscess.  This was I&D with small amount of pus.  Encouraged patient to continue taking antibiotics prescribed yesterday.   ____________________________________________   FINAL CLINICAL IMPRESSION(S) / ED DIAGNOSES  Final diagnoses:  Abscess     Note: This dictation was prepared with Dragon dictation. Any transcriptional errors that result from this process  are unintentional     Nance Pear, MD 06/15/19 1400

## 2019-06-15 NOTE — ED Notes (Addendum)
Pt states he noticed a black dot on his left elbow Thursday- the area stared swelling Saturday- was seen yesterday at urgent care and has taken 2 doses of doxycycline but the swelling has gotten worse

## 2019-06-15 NOTE — ED Triage Notes (Signed)
Left arm and elbow swelling X 3 days. Pt reports started as abscess type looking. Swelling and redness present to left elbow currently. Pt alert and oriented X4, cooperative, RR even and unlabored, color WNL. Pt in NAD.

## 2019-06-15 NOTE — Telephone Encounter (Signed)
Patient came into office with left elbow pain, swelling and redness, patient was triaged , he stated that he found what he thought was an insect bite of his elbow last Thursday 06/09/19, and that by Saturday the elbow started swelling, patient was advised on 06/14/19 to go to UC and patient followed advice. [place] clinic walkin received doxycycline 100 mg BID for cellulitis to elbow , patient had taken a total of 2 doses .  Patient used permanent marker to mark redness around area last night which had spreaded beyond marking , so patient remarked area at 8:30 am, and redness continued to spread with swelling. Patient rated pain on arrival at 7 , arm and joint very ho to touch , vitals taken BP 118/82 pulse 85, temp 98.2 02 sats 99 respirations 18. Consulted with PCP and in agreement patient needs to be evaluated at ER. Patient advised and he staed he would go to [facility] ER.

## 2019-06-15 NOTE — Telephone Encounter (Signed)
Copied from [place] [id number]. Topic: General - Other >> Jun 15, 2019  9:13 AM Leward Quan A wrote: Reason for CRM: Patient mom Marylin Crosby called to request a call back from Dr Nicki Reaper or Wannetta Sender she say that he went to urgent care for the swelling on his left elbow. States that he was given Doxycycline and told to apply warm compress and he is doing that but this morning the swelling have gotten worst. She will upload the picturess on My Chart for Dr Nicki Reaper to see. Ph# [phone]

## 2019-06-16 NOTE — Telephone Encounter (Signed)
Pt scheduled for an appt with surgery tomorrow.  See note.

## 2019-06-16 NOTE — Telephone Encounter (Signed)
Patient mom says till swollen and redness still there, area was packed, she stated and bandaged should she remove bandage and packing? Patient mother ask if there would be a charge for office yesterday advised no there would not be and phone went out tried to recall patient went straight to voicemail. Left message that  I would call back.

## 2019-06-16 NOTE — Telephone Encounter (Signed)
Order placed for surgery referral.  

## 2019-06-16 NOTE — Telephone Encounter (Signed)
Spoke with patient mother they are agreeable to surgeon taking a look just to make sure on right track with arm, family did ask to please all home number and leave message if no answer as to appointment place and time . [phone].

## 2019-06-16 NOTE — Telephone Encounter (Signed)
He has an appt scheduled for tomorrow with Dr Hampton Abbot.  Melissa has left them a message.  Have his mother check bandage to confirm no increased infection.  Will see surgery tomorrow.  Continue abx.  Let me know if needs anything more.

## 2019-06-16 NOTE — Telephone Encounter (Signed)
Please see how it is looking today.  Redness improved?  Pending results, may need surgery to evaluate and follow.  Just trying to save him two visits

## 2019-06-16 NOTE — Telephone Encounter (Signed)
Called and confirmed Message received, concerning appointment with surgeon, appointment verified.

## 2019-06-17 ENCOUNTER — Encounter: Payer: Self-pay | Admitting: Surgery

## 2019-06-17 ENCOUNTER — Other Ambulatory Visit: Payer: Self-pay

## 2019-06-17 ENCOUNTER — Ambulatory Visit (INDEPENDENT_AMBULATORY_CARE_PROVIDER_SITE_OTHER): Payer: No Typology Code available for payment source | Admitting: Surgery

## 2019-06-17 VITALS — BP 124/80 | HR 82 | Temp 98.2°F | Ht 68.0 in | Wt 136.0 lb

## 2019-06-17 DIAGNOSIS — L02414 Cutaneous abscess of left upper limb: Secondary | ICD-10-CM | POA: Diagnosis not present

## 2019-06-17 NOTE — Progress Notes (Signed)
06/17/2019  Reason for Visit:  Left elbow abscess  History of Present Illness: Joshua Atkinson is a 28 y.o. male presenting for evaluation of a left elbow abscess.  The patient reports that this started last weekend and progressively worsened, resulting in significant cellulitis and induration going from the dorsal aspect of the elbow towards the mid forearm.  He went to Urgent care this week and was given Doxycycline but the infection continued.  He presented to the ED on 8/19 and had an I&D of the area of fluctuance at the elbow and was continued on his antibiotic.  The I&D site was packed with 1/4 inch iodoform gauze.  He reports today that finally the cellulitis and induration are subsiding and he is able to move the elbow joint much better now.  Denies any fevers at home.  His and his mother's main concern is that they had no instructions on what to do with the wound packing.  Of note, he has a history of kidney transplant and is on immunosuppressant therapy with tacrolimus and mycophenolate.  Past Medical History: Past Medical History:  Diagnosis Date  . Allergy   . Chronic kidney disease   . 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 History of chicken pox   . Kidney transplanted      Past Surgical History: Past Surgical History:  Procedure Laterality Date  . MYRINGOTOMY WITH TUBE PLACEMENT Bilateral    multiple as a child  . RENAL BIOPSY  1996   Nephrotic Syndrome    Home Medications: Prior to Admission medications   Medication Sig Start Date End Date Taking? Authorizing Provider  albuterol (PROVENTIL HFA;VENTOLIN HFA) 108 (90 Base) MCG/ACT inhaler Inhale 2 puffs into the lungs every 6 (six) hours as needed for wheezing or shortness of breath. 12/28/18  Yes Guse, Jacquelynn Cree, FNP  Cholecalciferol (HM VITAMIN D3) 100 MCG (4000 UT) CAPS Take 1 capsule by mouth daily.    Yes [provider]  Cysteamine Bitartrate (CYSTAGON PO) Take 300 mg by mouth 4 (four) times daily.   Yes [provider]  levothyroxine (SYNTHROID, LEVOTHROID) 50 MCG tablet Take 50 mcg by mouth daily before breakfast.   Yes [provider]  loratadine (CLARITIN) 10 MG tablet Take 10 mg by mouth daily.   Yes [provider]  mycophenolate (MYFORTIC) 180 MG EC tablet Take 180 mg by mouth 2 (two) times daily.   Yes [provider]  tacrolimus (PROGRAF) 1 MG capsule Take 3 mg by mouth 2 (two) times daily.   Yes [provider]  tricitrates [facility]) [phone] MG/5ML SOLN Take 40 mLs by mouth 3 (three) times daily.   Yes [provider]    Allergies: Allergies  Allergen Reactions  . Sulfa Antibiotics Hives    Other reaction(s): SWELLING/EDEMA Other reaction(s): SWELLING/EDEMA   . Advil [Ibuprofen]     Kidney problems  . Septra [Sulfamethoxazole-Trimethoprim]     Nephrotic Syndrome    Social History:  reports that he has never smoked. He has never used smokeless tobacco. He reports that he does not drink alcohol or use drugs.   Family History: Family History  Problem Relation Age of Onset  . Arthritis Mother   . Cancer Mother        breast cancer  . Arthritis Father   . Hypertension Father   . Arthritis Maternal Grandmother   . Hypertension Paternal Grandmother   . Hypertension Paternal Grandfather   . Diabetes Paternal Grandfather     Review  of Systems: Review of Systems  Constitutional: Negative for chills and fever.  HENT: Negative for hearing loss.   Respiratory: Negative for shortness of breath.   Cardiovascular: Negative for chest pain.  Gastrointestinal: Negative for nausea and vomiting.  Genitourinary: Negative for dysuria.  Musculoskeletal: Negative for myalgias.  Skin:       Cellulitis and induration around elbow and forearm on the left  Neurological: Negative for dizziness.  Psychiatric/Behavioral: Negative for depression.    Physical Exam BP 124/80   Pulse 82   Temp 98.2 F (36.8 C) (Skin)   Ht 5\' 8"   (1.727 m)   Wt 136 lb (61.7 kg)   SpO2 98%   BMI 20.68 kg/m  CONSTITUTIONAL: No acute distress HEENT:  Normocephalic, atraumatic, extraocular motion intact. NECK: Trachea is midline, and there is no jugular venous distension.  RESPIRATORY:  Lungs are clear, and breath sounds are equal bilaterally. Normal respiratory effort without pathologic use of accessory muscles. CARDIOVASCULAR: Heart is regular without murmurs, gallops, or rubs. GI: The abdomen is soft, non-distended, non-tender.  MUSCULOSKELETAL:  Normal muscle strength and tone in all four extremities.  No peripheral edema or cyanosis. SKIN: Left dorsal aspect of elbow, just distal to joint, has a 7 mm incision site from abscess drainage.  There is no purulent drainage and his cellulitis appears to be resolving and there is no more induration.  The wick of gauze was removed and changed for new wick, covered with dry gauze and tape.  NEUROLOGIC:  Motor and sensation is grossly normal.  Cranial nerves are grossly intact. PSYCH:  Alert and oriented to person, place and time. Affect is normal.  Laboratory Analysis: No results found for this or any previous visit (from the past 24 hour(s)).  Imaging: No results found.  Assessment and Plan: This is a 28 y.o. male s/p I&D of left elbow abscess  Discussed with patient that the wound is healing well and does not appear to have any worsening infection and does not need further I&D at this point.  Packed the wound again with 1/4 inch gauze.  Instructed that this should be done once daily.  He may shower but should remove dressing prior to shower and change it afterwards.  He will notice that as the wound gets smaller, he will be unable to pack the wound further and at that point can transition to just dry gauze superficial dressing.  He should continue his antibiotic as prescribed.  Given his immunocompromised status, I would like to see him again next week to check on his  progress.  Face-to-face time spent with the patient and care providers was 40 minutes, with more than 50% of the time spent counseling, educating, and coordinating care of the patient.     Melvyn Neth, [facility]

## 2019-06-17 NOTE — Patient Instructions (Signed)
Continue with wound packing daily. Please see your follow up appointment listed below.

## 2019-06-24 ENCOUNTER — Ambulatory Visit (INDEPENDENT_AMBULATORY_CARE_PROVIDER_SITE_OTHER): Payer: No Typology Code available for payment source | Admitting: Physician Assistant

## 2019-06-24 ENCOUNTER — Other Ambulatory Visit: Payer: Self-pay

## 2019-06-24 ENCOUNTER — Encounter: Payer: Self-pay | Admitting: Physician Assistant

## 2019-06-24 VITALS — BP 112/72 | HR 82 | Temp 97.9°F | Ht 68.0 in | Wt 137.0 lb

## 2019-06-24 DIAGNOSIS — L02414 Cutaneous abscess of left upper limb: Secondary | ICD-10-CM

## 2019-06-24 NOTE — Progress Notes (Signed)
[facility] NOTE  06/24/2019  History of Present Illness: Joshua Atkinson is a 28 y.o. male with a history of left elbow abscess. He was seen in the ED on 08/19 and had incision and drainage done. He followed up on 08/21 with Dr Hampton Abbot and at that time was doing well.  He presents today for wound check. He reports that the abscess has "pretty much healed up. No further erythema or drainage, He can no longer pack the wound. No fever or chills. He has finished his course of doxycycline. No other acute issues.    Past Medical History: Past Medical History:  Diagnosis Date  . Allergy   . Chronic kidney disease   . 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 History of chicken pox   . Kidney transplanted      Past Surgical History: Past Surgical History:  Procedure Laterality Date  . MYRINGOTOMY WITH TUBE PLACEMENT Bilateral    multiple as a child  . RENAL BIOPSY  1996   Nephrotic Syndrome    Home Medications: Prior to Admission medications   Medication Sig Start Date End Date Taking? Authorizing Provider  albuterol (PROVENTIL HFA;VENTOLIN HFA) 108 (90 Base) MCG/ACT inhaler Inhale 2 puffs into the lungs every 6 (six) hours as needed for wheezing or shortness of breath. 12/28/18   Jodelle Green, FNP  Cholecalciferol (HM VITAMIN D3) 100 MCG (4000 UT) CAPS Take 1 capsule by mouth daily.     [provider]  Cysteamine Bitartrate (CYSTAGON PO) Take 300 mg by mouth 4 (four) times daily.    [provider]  levothyroxine (SYNTHROID, LEVOTHROID) 50 MCG tablet Take 50 mcg by mouth daily before breakfast.    [provider]  loratadine (CLARITIN) 10 MG tablet Take 10 mg by mouth daily.    [provider]  mycophenolate (MYFORTIC) 180 MG EC tablet Take 180 mg by mouth 2 (two) times daily.    [provider]  tacrolimus (PROGRAF) 1 MG capsule Take 3 mg by mouth 2 (two) times daily.    [provider]  tricitrates [facility])  [phone] MG/5ML SOLN Take 40 mLs by mouth 3 (three) times daily.    [provider]    Allergies: Allergies  Allergen Reactions  . Sulfa Antibiotics Hives    Other reaction(s): SWELLING/EDEMA Other reaction(s): SWELLING/EDEMA   . Advil [Ibuprofen]     Kidney problems  . Septra [Sulfamethoxazole-Trimethoprim]     Nephrotic Syndrome    Review of Systems: Review of Systems  Constitutional: Negative for chills and fever.  Musculoskeletal: Negative for joint pain and myalgias.  Skin:       - Erythema, - Swelling  All other systems reviewed and are negative.   Physical Exam There were no vitals taken for this visit. CONSTITUTIONAL: Well appearing male, NAD HEENT:  Normocephalic, atraumatic, extraocular motion intact. RESPIRATORY:  Normal respiratory effort without pathologic use of accessory muscles. SKIN: Previous 7 mm left elbow incision is well healed, surrounding erythema and induration now resolved, non-tender, no residual abscess NEUROLOGIC:  Motor and sensation is grossly normal.  Cranial nerves are grossly intact. PSYCH:  Alert and oriented to person, place and time. Affect is normal.  Labs/Imaging: No pertinent imaging studies this visit.   Assessment and Plan: This is a 28 y.o. male resolved left elbow abscess   - return precautions given  - no further management; has completed ABx  - RTC PRN  Face-to-face time spent with the patient and care providers  was 10 minutes, with more than 50% of the time spent counseling, educating, and coordinating care of the patient.    -- Edison Simon, PA-C [facility] 06/24/2019, 9:14 AM [phone] M-F: 7am - 4pm

## 2019-06-24 NOTE — Patient Instructions (Signed)
   Follow-up with our office as needed.  Please call and ask to speak with a nurse if you develop questions or concerns.  

## 2019-06-27 MED ORDER — LEVOTHYROXINE 50 MCG TABLET
0 refills | 0 days | Status: CP
Start: 2019-06-27 — End: ?

## 2019-07-08 ENCOUNTER — Encounter
Admit: 2019-07-08 | Discharge: 2019-07-08 | Payer: PRIVATE HEALTH INSURANCE | Attending: Nephrology | Primary: Nephrology

## 2019-07-08 ENCOUNTER — Encounter: Admit: 2019-07-08 | Discharge: 2019-07-08 | Payer: PRIVATE HEALTH INSURANCE

## 2019-07-08 DIAGNOSIS — Z114 Encounter for screening for human immunodeficiency virus [HIV]: Secondary | ICD-10-CM

## 2019-07-08 DIAGNOSIS — N1330 Unspecified hydronephrosis: Secondary | ICD-10-CM

## 2019-07-08 DIAGNOSIS — N3289 Other specified disorders of bladder: Secondary | ICD-10-CM

## 2019-07-08 DIAGNOSIS — Z1159 Encounter for screening for other viral diseases: Secondary | ICD-10-CM

## 2019-07-08 DIAGNOSIS — N189 Chronic kidney disease, unspecified: Secondary | ICD-10-CM

## 2019-07-08 DIAGNOSIS — E039 Hypothyroidism, unspecified: Secondary | ICD-10-CM

## 2019-07-08 DIAGNOSIS — Z7952 Long term (current) use of systemic steroids: Secondary | ICD-10-CM

## 2019-07-08 DIAGNOSIS — E7204 Cystinosis: Secondary | ICD-10-CM

## 2019-07-08 DIAGNOSIS — Z940 Kidney transplant status: Secondary | ICD-10-CM

## 2019-07-08 DIAGNOSIS — D72819 Decreased white blood cell count, unspecified: Secondary | ICD-10-CM

## 2019-07-08 DIAGNOSIS — Z4822 Encounter for aftercare following kidney transplant: Secondary | ICD-10-CM

## 2019-07-08 DIAGNOSIS — D631 Anemia in chronic kidney disease: Secondary | ICD-10-CM

## 2019-07-08 DIAGNOSIS — Z23 Encounter for immunization: Secondary | ICD-10-CM

## 2019-07-08 DIAGNOSIS — N261 Atrophy of kidney (terminal): Secondary | ICD-10-CM

## 2019-07-08 DIAGNOSIS — B259 Cytomegaloviral disease, unspecified: Secondary | ICD-10-CM

## 2019-07-08 DIAGNOSIS — Z79899 Other long term (current) drug therapy: Secondary | ICD-10-CM

## 2019-07-08 DIAGNOSIS — D899 Disorder involving the immune mechanism, unspecified: Secondary | ICD-10-CM

## 2019-07-08 DIAGNOSIS — E872 Acidosis: Secondary | ICD-10-CM

## 2019-07-25 DIAGNOSIS — Z940 Kidney transplant status: Secondary | ICD-10-CM

## 2019-07-28 DIAGNOSIS — Z940 Kidney transplant status: Secondary | ICD-10-CM

## 2019-08-15 DIAGNOSIS — Z79899 Other long term (current) drug therapy: Principal | ICD-10-CM

## 2019-08-15 DIAGNOSIS — Z1159 Encounter for screening for other viral diseases: Principal | ICD-10-CM

## 2019-08-15 DIAGNOSIS — Z940 Kidney transplant status: Principal | ICD-10-CM

## 2019-08-28 DIAGNOSIS — U071 COVID-19: Secondary | ICD-10-CM

## 2019-08-28 HISTORY — DX: COVID-19: U07.1

## 2019-09-01 ENCOUNTER — Encounter: Admit: 2019-09-01 | Discharge: 2019-09-02 | Payer: PRIVATE HEALTH INSURANCE | Attending: Family | Primary: Family

## 2019-09-12 DIAGNOSIS — Z1159 Encounter for screening for other viral diseases: Principal | ICD-10-CM

## 2019-09-12 DIAGNOSIS — Z940 Kidney transplant status: Principal | ICD-10-CM

## 2019-09-12 DIAGNOSIS — Z79899 Other long term (current) drug therapy: Principal | ICD-10-CM

## 2019-09-27 ENCOUNTER — Encounter: Admit: 2019-09-27 | Discharge: 2019-09-28 | Payer: PRIVATE HEALTH INSURANCE | Attending: Family | Primary: Family

## 2019-10-10 DIAGNOSIS — Z79899 Other long term (current) drug therapy: Principal | ICD-10-CM

## 2019-10-10 DIAGNOSIS — Z940 Kidney transplant status: Principal | ICD-10-CM

## 2019-10-10 DIAGNOSIS — Z1159 Encounter for screening for other viral diseases: Principal | ICD-10-CM

## 2019-10-20 MED ORDER — LEVOTHYROXINE 50 MCG TABLET
5 refills | 0 days | Status: CP
Start: 2019-10-20 — End: 2020-10-19

## 2019-11-07 DIAGNOSIS — Z1159 Encounter for screening for other viral diseases: Principal | ICD-10-CM

## 2019-11-07 DIAGNOSIS — Z940 Kidney transplant status: Principal | ICD-10-CM

## 2019-11-07 DIAGNOSIS — Z79899 Other long term (current) drug therapy: Principal | ICD-10-CM

## 2019-11-09 ENCOUNTER — Encounter: Admit: 2019-11-09 | Discharge: 2019-11-10 | Payer: PRIVATE HEALTH INSURANCE

## 2019-11-09 ENCOUNTER — Ambulatory Visit
Admit: 2019-11-09 | Discharge: 2019-11-10 | Payer: PRIVATE HEALTH INSURANCE | Attending: Nephrology | Primary: Nephrology

## 2019-11-09 ENCOUNTER — Ambulatory Visit: Admit: 2019-11-09 | Discharge: 2019-11-10 | Payer: PRIVATE HEALTH INSURANCE

## 2019-11-09 DIAGNOSIS — E7204 Cystinosis: Principal | ICD-10-CM

## 2019-11-09 DIAGNOSIS — N186 End stage renal disease: Principal | ICD-10-CM

## 2019-11-09 DIAGNOSIS — Z48298 Encounter for aftercare following other organ transplant: Principal | ICD-10-CM

## 2019-11-09 DIAGNOSIS — E872 Acidosis: Principal | ICD-10-CM

## 2019-11-09 DIAGNOSIS — Z940 Kidney transplant status: Principal | ICD-10-CM

## 2019-11-09 DIAGNOSIS — Z1159 Encounter for screening for other viral diseases: Principal | ICD-10-CM

## 2019-11-09 DIAGNOSIS — Z7682 Awaiting organ transplant status: Principal | ICD-10-CM

## 2019-11-09 DIAGNOSIS — Z79899 Other long term (current) drug therapy: Principal | ICD-10-CM

## 2019-11-09 DIAGNOSIS — Z01818 Encounter for other preprocedural examination: Principal | ICD-10-CM

## 2019-11-09 DIAGNOSIS — D899 Disorder involving the immune mechanism, unspecified: Principal | ICD-10-CM

## 2019-11-29 DIAGNOSIS — Z940 Kidney transplant status: Principal | ICD-10-CM

## 2019-11-29 DIAGNOSIS — Z79899 Other long term (current) drug therapy: Principal | ICD-10-CM

## 2019-11-29 DIAGNOSIS — Z1159 Encounter for screening for other viral diseases: Principal | ICD-10-CM

## 2019-12-05 ENCOUNTER — Encounter: Admit: 2019-12-05 | Discharge: 2019-12-06 | Payer: PRIVATE HEALTH INSURANCE

## 2019-12-05 DIAGNOSIS — Z1159 Encounter for screening for other viral diseases: Principal | ICD-10-CM

## 2019-12-05 DIAGNOSIS — Z940 Kidney transplant status: Principal | ICD-10-CM

## 2019-12-05 DIAGNOSIS — Z79899 Other long term (current) drug therapy: Principal | ICD-10-CM

## 2020-01-02 DIAGNOSIS — Z1159 Encounter for screening for other viral diseases: Principal | ICD-10-CM

## 2020-01-02 DIAGNOSIS — Z940 Kidney transplant status: Principal | ICD-10-CM

## 2020-01-02 DIAGNOSIS — Z79899 Other long term (current) drug therapy: Principal | ICD-10-CM

## 2020-01-13 DIAGNOSIS — Z940 Kidney transplant status: Principal | ICD-10-CM

## 2020-01-19 DIAGNOSIS — Z940 Kidney transplant status: Principal | ICD-10-CM

## 2020-01-19 MED ORDER — TACROLIMUS 1 MG CAPSULE, IMMEDIATE-RELEASE
ORAL_CAPSULE | Freq: Two times a day (BID) | ORAL | 2 refills | 90.00000 days | Status: CP
Start: 2020-01-19 — End: 2021-01-18

## 2020-01-30 DIAGNOSIS — Z940 Kidney transplant status: Principal | ICD-10-CM

## 2020-01-30 DIAGNOSIS — Z1159 Encounter for screening for other viral diseases: Principal | ICD-10-CM

## 2020-01-30 DIAGNOSIS — Z79899 Other long term (current) drug therapy: Principal | ICD-10-CM

## 2020-02-08 DIAGNOSIS — E7204 Cystinosis: Principal | ICD-10-CM

## 2020-02-08 MED ORDER — CYSTAGON 150 MG CAPSULE
ORAL_CAPSULE | 8 refills | 0 days | Status: CP
Start: 2020-02-08 — End: ?

## 2020-02-27 DIAGNOSIS — Z940 Kidney transplant status: Principal | ICD-10-CM

## 2020-02-27 DIAGNOSIS — Z1159 Encounter for screening for other viral diseases: Principal | ICD-10-CM

## 2020-02-27 DIAGNOSIS — Z79899 Other long term (current) drug therapy: Principal | ICD-10-CM

## 2020-03-18 MED ORDER — TRICITRATES 550 MG-500 MG-334 MG/5 ML ORAL SOLUTION
5 refills | 0 days | Status: CP
Start: 2020-03-18 — End: 2021-03-18

## 2020-03-26 DIAGNOSIS — Z1159 Encounter for screening for other viral diseases: Principal | ICD-10-CM

## 2020-03-26 DIAGNOSIS — Z940 Kidney transplant status: Principal | ICD-10-CM

## 2020-03-26 DIAGNOSIS — Z79899 Other long term (current) drug therapy: Principal | ICD-10-CM

## 2020-04-03 DIAGNOSIS — Z940 Kidney transplant status: Principal | ICD-10-CM

## 2020-04-03 MED ORDER — TACROLIMUS 1 MG CAPSULE, IMMEDIATE-RELEASE
ORAL_CAPSULE | ORAL | 2 refills | 0.00000 days | Status: CP
Start: 2020-04-03 — End: ?

## 2020-04-04 MED ORDER — PREDNISONE 5 MG TABLET
ORAL_TABLET | Freq: Every day | ORAL | 3 refills | 90.00000 days | Status: CP
Start: 2020-04-04 — End: 2021-04-04

## 2020-04-11 DIAGNOSIS — Z940 Kidney transplant status: Principal | ICD-10-CM

## 2020-04-23 DIAGNOSIS — Z940 Kidney transplant status: Principal | ICD-10-CM

## 2020-04-23 DIAGNOSIS — Z79899 Other long term (current) drug therapy: Principal | ICD-10-CM

## 2020-04-23 DIAGNOSIS — Z1159 Encounter for screening for other viral diseases: Principal | ICD-10-CM

## 2020-05-07 DIAGNOSIS — Z940 Kidney transplant status: Principal | ICD-10-CM

## 2020-05-07 DIAGNOSIS — Z1159 Encounter for screening for other viral diseases: Principal | ICD-10-CM

## 2020-05-07 DIAGNOSIS — Z79899 Other long term (current) drug therapy: Principal | ICD-10-CM

## 2020-05-09 ENCOUNTER — Encounter
Admit: 2020-05-09 | Discharge: 2020-05-10 | Payer: PRIVATE HEALTH INSURANCE | Attending: Nephrology | Primary: Nephrology

## 2020-05-09 ENCOUNTER — Ambulatory Visit: Admit: 2020-05-09 | Discharge: 2020-05-10 | Payer: PRIVATE HEALTH INSURANCE

## 2020-05-09 DIAGNOSIS — Z940 Kidney transplant status: Principal | ICD-10-CM

## 2020-05-09 DIAGNOSIS — Z48298 Encounter for aftercare following other organ transplant: Principal | ICD-10-CM

## 2020-05-09 DIAGNOSIS — Z79899 Other long term (current) drug therapy: Principal | ICD-10-CM

## 2020-05-09 DIAGNOSIS — D849 Immunodeficiency, unspecified: Principal | ICD-10-CM

## 2020-05-09 DIAGNOSIS — Z1159 Encounter for screening for other viral diseases: Principal | ICD-10-CM

## 2020-05-09 DIAGNOSIS — Z01818 Encounter for other preprocedural examination: Principal | ICD-10-CM

## 2020-05-09 DIAGNOSIS — N186 End stage renal disease: Principal | ICD-10-CM

## 2020-05-09 DIAGNOSIS — Z7682 Awaiting organ transplant status: Principal | ICD-10-CM

## 2020-05-10 DIAGNOSIS — Z1159 Encounter for screening for other viral diseases: Principal | ICD-10-CM

## 2020-05-10 DIAGNOSIS — Z940 Kidney transplant status: Principal | ICD-10-CM

## 2020-05-10 DIAGNOSIS — E7204 Cystinosis: Principal | ICD-10-CM

## 2020-05-10 DIAGNOSIS — Z79899 Other long term (current) drug therapy: Principal | ICD-10-CM

## 2020-05-16 DIAGNOSIS — Z940 Kidney transplant status: Principal | ICD-10-CM

## 2020-05-17 DIAGNOSIS — Z940 Kidney transplant status: Principal | ICD-10-CM

## 2020-05-17 MED ORDER — MYCOPHENOLATE SODIUM 180 MG TABLET,DELAYED RELEASE
ORAL_TABLET | Freq: Two times a day (BID) | ORAL | 3 refills | 90.00000 days | Status: CP
Start: 2020-05-17 — End: 2021-05-17

## 2020-05-17 MED ORDER — MYCOPHENOLATE SODIUM 180 MG TABLET,DELAYED RELEASE: 180 mg | tablet | Freq: Two times a day (BID) | 3 refills | 90 days | Status: AC

## 2020-05-21 ENCOUNTER — Encounter: Admit: 2020-05-21 | Discharge: 2020-05-22 | Payer: PRIVATE HEALTH INSURANCE

## 2020-05-21 DIAGNOSIS — Z1159 Encounter for screening for other viral diseases: Principal | ICD-10-CM

## 2020-05-21 DIAGNOSIS — Z79899 Other long term (current) drug therapy: Principal | ICD-10-CM

## 2020-05-21 DIAGNOSIS — Z940 Kidney transplant status: Principal | ICD-10-CM

## 2020-05-22 DIAGNOSIS — R002 Palpitations: Principal | ICD-10-CM

## 2020-05-22 DIAGNOSIS — Z1159 Encounter for screening for other viral diseases: Principal | ICD-10-CM

## 2020-05-22 DIAGNOSIS — I493 Ventricular premature depolarization: Principal | ICD-10-CM

## 2020-05-22 DIAGNOSIS — E7204 Cystinosis: Principal | ICD-10-CM

## 2020-05-22 DIAGNOSIS — R0789 Other chest pain: Principal | ICD-10-CM

## 2020-05-22 DIAGNOSIS — Z940 Kidney transplant status: Principal | ICD-10-CM

## 2020-06-18 DIAGNOSIS — Z79899 Other long term (current) drug therapy: Principal | ICD-10-CM

## 2020-06-18 DIAGNOSIS — Z940 Kidney transplant status: Principal | ICD-10-CM

## 2020-06-18 DIAGNOSIS — Z1159 Encounter for screening for other viral diseases: Principal | ICD-10-CM

## 2020-07-03 DIAGNOSIS — Z940 Kidney transplant status: Principal | ICD-10-CM

## 2020-07-03 MED ORDER — TACROLIMUS 1 MG CAPSULE, IMMEDIATE-RELEASE
ORAL_CAPSULE | 2 refills | 0 days | Status: CP
Start: 2020-07-03 — End: ?

## 2020-07-13 DIAGNOSIS — Z79899 Other long term (current) drug therapy: Principal | ICD-10-CM

## 2020-07-13 DIAGNOSIS — Z1159 Encounter for screening for other viral diseases: Principal | ICD-10-CM

## 2020-07-13 DIAGNOSIS — Z940 Kidney transplant status: Principal | ICD-10-CM

## 2020-08-06 DIAGNOSIS — Z79899 Other long term (current) drug therapy: Principal | ICD-10-CM

## 2020-08-06 DIAGNOSIS — Z1159 Encounter for screening for other viral diseases: Principal | ICD-10-CM

## 2020-08-06 DIAGNOSIS — Z940 Kidney transplant status: Principal | ICD-10-CM

## 2020-09-03 DIAGNOSIS — Z79899 Other long term (current) drug therapy: Principal | ICD-10-CM

## 2020-09-03 DIAGNOSIS — Z1159 Encounter for screening for other viral diseases: Principal | ICD-10-CM

## 2020-09-03 DIAGNOSIS — Z940 Kidney transplant status: Principal | ICD-10-CM

## 2020-09-03 MED ORDER — TACROLIMUS 1 MG CAPSULE, IMMEDIATE-RELEASE
0 days
Start: 2020-09-03 — End: ?

## 2020-09-17 MED ORDER — CYSTADROPS 0.37 % EYE DROPS
Freq: Four times a day (QID) | OPHTHALMIC | 0 days
Start: 2020-09-17 — End: 2021-11-12

## 2020-09-30 ENCOUNTER — Encounter: Admit: 2020-09-30 | Discharge: 2020-09-30 | Payer: PRIVATE HEALTH INSURANCE | Attending: Surgery | Primary: Surgery

## 2020-09-30 ENCOUNTER — Ambulatory Visit
Admit: 2020-09-30 | Discharge: 2020-09-30 | Disposition: A | Discharge status: Home / Self Care | Payer: PRIVATE HEALTH INSURANCE

## 2020-09-30 DIAGNOSIS — Z20822 Contact with and (suspected) exposure to covid-19: Principal | ICD-10-CM

## 2020-09-30 DIAGNOSIS — Z79899 Other long term (current) drug therapy: Principal | ICD-10-CM

## 2020-09-30 DIAGNOSIS — Z01818 Encounter for other preprocedural examination: Principal | ICD-10-CM

## 2020-09-30 DIAGNOSIS — Z538 Procedure and treatment not carried out for other reasons: Principal | ICD-10-CM

## 2020-09-30 DIAGNOSIS — Z79890 Hormone replacement therapy (postmenopausal): Principal | ICD-10-CM

## 2020-09-30 DIAGNOSIS — E039 Hypothyroidism, unspecified: Principal | ICD-10-CM

## 2020-09-30 DIAGNOSIS — Z940 Kidney transplant status: Principal | ICD-10-CM

## 2020-09-30 DIAGNOSIS — Z7682 Awaiting organ transplant status: Principal | ICD-10-CM

## 2020-09-30 DIAGNOSIS — Z8616 Personal history of COVID-19: Principal | ICD-10-CM

## 2020-09-30 DIAGNOSIS — T17990A Other foreign object in respiratory tract, part unspecified in causing asphyxiation, initial encounter: Principal | ICD-10-CM

## 2020-09-30 DIAGNOSIS — D631 Anemia in chronic kidney disease: Principal | ICD-10-CM

## 2020-09-30 DIAGNOSIS — Z882 Allergy status to sulfonamides status: Principal | ICD-10-CM

## 2020-09-30 DIAGNOSIS — T8619 Other complication of kidney transplant: Principal | ICD-10-CM

## 2020-09-30 DIAGNOSIS — D84821 Immunodeficiency due to drugs: Principal | ICD-10-CM

## 2020-09-30 DIAGNOSIS — N186 End stage renal disease: Principal | ICD-10-CM

## 2020-09-30 DIAGNOSIS — Z7952 Long term (current) use of systemic steroids: Principal | ICD-10-CM

## 2020-09-30 DIAGNOSIS — J9811 Atelectasis: Principal | ICD-10-CM

## 2020-09-30 DIAGNOSIS — E7204 Cystinosis: Principal | ICD-10-CM

## 2020-09-30 DIAGNOSIS — D849 Immunodeficiency, unspecified: Principal | ICD-10-CM

## 2020-10-01 ENCOUNTER — Encounter
Admit: 2020-10-01 | Discharge: 2020-10-05 | Disposition: A | Discharge status: Home / Self Care | Payer: PRIVATE HEALTH INSURANCE | Attending: Anesthesiology | Admitting: Surgery

## 2020-10-01 ENCOUNTER — Ambulatory Visit
Admit: 2020-10-01 | Discharge: 2020-10-05 | Disposition: A | Discharge status: Home / Self Care | Payer: PRIVATE HEALTH INSURANCE | Admitting: Surgery

## 2020-10-01 DIAGNOSIS — Z8616 Personal history of COVID-19: Principal | ICD-10-CM

## 2020-10-01 DIAGNOSIS — D631 Anemia in chronic kidney disease: Principal | ICD-10-CM

## 2020-10-01 DIAGNOSIS — E039 Hypothyroidism, unspecified: Principal | ICD-10-CM

## 2020-10-01 DIAGNOSIS — D84821 Immunodeficiency due to drugs: Principal | ICD-10-CM

## 2020-10-01 DIAGNOSIS — J9811 Atelectasis: Principal | ICD-10-CM

## 2020-10-01 DIAGNOSIS — Z20822 Contact with and (suspected) exposure to covid-19: Principal | ICD-10-CM

## 2020-10-01 DIAGNOSIS — T8619 Other complication of kidney transplant: Principal | ICD-10-CM

## 2020-10-01 DIAGNOSIS — D849 Immunodeficiency, unspecified: Principal | ICD-10-CM

## 2020-10-01 DIAGNOSIS — Z882 Allergy status to sulfonamides status: Principal | ICD-10-CM

## 2020-10-01 DIAGNOSIS — Z79899 Other long term (current) drug therapy: Principal | ICD-10-CM

## 2020-10-01 DIAGNOSIS — N186 End stage renal disease: Principal | ICD-10-CM

## 2020-10-01 DIAGNOSIS — Z79890 Hormone replacement therapy (postmenopausal): Principal | ICD-10-CM

## 2020-10-01 DIAGNOSIS — T17990A Other foreign object in respiratory tract, part unspecified in causing asphyxiation, initial encounter: Principal | ICD-10-CM

## 2020-10-01 DIAGNOSIS — E7204 Cystinosis: Principal | ICD-10-CM

## 2020-10-01 DIAGNOSIS — Z7952 Long term (current) use of systemic steroids: Principal | ICD-10-CM

## 2020-10-01 DIAGNOSIS — Z940 Kidney transplant status: Principal | ICD-10-CM

## 2020-10-01 DIAGNOSIS — Z1159 Encounter for screening for other viral diseases: Principal | ICD-10-CM

## 2020-10-02 DIAGNOSIS — Z20822 Contact with and (suspected) exposure to covid-19: Principal | ICD-10-CM

## 2020-10-02 DIAGNOSIS — Z79890 Hormone replacement therapy (postmenopausal): Principal | ICD-10-CM

## 2020-10-02 DIAGNOSIS — E7204 Cystinosis: Principal | ICD-10-CM

## 2020-10-02 DIAGNOSIS — T17990A Other foreign object in respiratory tract, part unspecified in causing asphyxiation, initial encounter: Principal | ICD-10-CM

## 2020-10-02 DIAGNOSIS — Z7952 Long term (current) use of systemic steroids: Principal | ICD-10-CM

## 2020-10-02 DIAGNOSIS — Z882 Allergy status to sulfonamides status: Principal | ICD-10-CM

## 2020-10-02 DIAGNOSIS — Z8616 Personal history of COVID-19: Principal | ICD-10-CM

## 2020-10-02 DIAGNOSIS — J9811 Atelectasis: Principal | ICD-10-CM

## 2020-10-02 DIAGNOSIS — E039 Hypothyroidism, unspecified: Principal | ICD-10-CM

## 2020-10-02 DIAGNOSIS — D84821 Immunodeficiency due to drugs: Principal | ICD-10-CM

## 2020-10-02 DIAGNOSIS — T8619 Other complication of kidney transplant: Principal | ICD-10-CM

## 2020-10-02 DIAGNOSIS — Z79899 Other long term (current) drug therapy: Principal | ICD-10-CM

## 2020-10-02 DIAGNOSIS — D849 Immunodeficiency, unspecified: Principal | ICD-10-CM

## 2020-10-02 DIAGNOSIS — D631 Anemia in chronic kidney disease: Principal | ICD-10-CM

## 2020-10-02 DIAGNOSIS — N186 End stage renal disease: Principal | ICD-10-CM

## 2020-10-02 MED ORDER — VALGANCICLOVIR 450 MG TABLET
ORAL_TABLET | Freq: Every day | ORAL | 2 refills | 30 days | Status: CP
Start: 2020-10-02 — End: 2020-10-05

## 2020-10-03 DIAGNOSIS — Z940 Kidney transplant status: Principal | ICD-10-CM

## 2020-10-03 DIAGNOSIS — Z8616 Personal history of COVID-19: Principal | ICD-10-CM

## 2020-10-03 DIAGNOSIS — D84821 Immunodeficiency due to drugs: Principal | ICD-10-CM

## 2020-10-03 DIAGNOSIS — T8619 Other complication of kidney transplant: Principal | ICD-10-CM

## 2020-10-03 DIAGNOSIS — Z4822 Encounter for aftercare following kidney transplant: Principal | ICD-10-CM

## 2020-10-03 DIAGNOSIS — D849 Immunodeficiency, unspecified: Principal | ICD-10-CM

## 2020-10-03 DIAGNOSIS — D631 Anemia in chronic kidney disease: Principal | ICD-10-CM

## 2020-10-03 DIAGNOSIS — N185 Chronic kidney disease, stage 5: Principal | ICD-10-CM

## 2020-10-03 DIAGNOSIS — J9811 Atelectasis: Principal | ICD-10-CM

## 2020-10-03 DIAGNOSIS — Z79899 Other long term (current) drug therapy: Principal | ICD-10-CM

## 2020-10-03 DIAGNOSIS — N186 End stage renal disease: Principal | ICD-10-CM

## 2020-10-03 DIAGNOSIS — Z882 Allergy status to sulfonamides status: Principal | ICD-10-CM

## 2020-10-03 DIAGNOSIS — Z79890 Hormone replacement therapy (postmenopausal): Principal | ICD-10-CM

## 2020-10-03 DIAGNOSIS — T17990A Other foreign object in respiratory tract, part unspecified in causing asphyxiation, initial encounter: Principal | ICD-10-CM

## 2020-10-03 DIAGNOSIS — E7204 Cystinosis: Principal | ICD-10-CM

## 2020-10-03 DIAGNOSIS — E039 Hypothyroidism, unspecified: Principal | ICD-10-CM

## 2020-10-03 DIAGNOSIS — Z7952 Long term (current) use of systemic steroids: Principal | ICD-10-CM

## 2020-10-03 DIAGNOSIS — Z20822 Contact with and (suspected) exposure to covid-19: Principal | ICD-10-CM

## 2020-10-03 MED ORDER — GABAPENTIN 100 MG CAPSULE
ORAL_CAPSULE | Freq: Every evening | ORAL | 0 refills | 12.00000 days | Status: CP
Start: 2020-10-03 — End: 2020-10-24
  Filled 2020-10-05: qty 12, 12d supply, fill #0

## 2020-10-03 MED ORDER — MYFORTIC 180 MG TABLET,DELAYED RELEASE
ORAL_TABLET | Freq: Two times a day (BID) | ORAL | 11 refills | 30.00000 days | Status: CP
Start: 2020-10-03 — End: 2020-10-04

## 2020-10-03 MED ORDER — ASPIRIN 81 MG TABLET,DELAYED RELEASE
ORAL_TABLET | Freq: Every day | ORAL | 0 refills | 30.00000 days | Status: CP
Start: 2020-10-03 — End: 2020-10-30

## 2020-10-03 MED ORDER — POLYETHYLENE GLYCOL 3350 17 GRAM/DOSE ORAL POWDER
Freq: Every day | ORAL | 0 refills | 30.00000 days | Status: CP | PRN
Start: 2020-10-03 — End: 2020-11-02

## 2020-10-03 MED ORDER — DOCUSATE SODIUM 100 MG CAPSULE
ORAL_CAPSULE | Freq: Two times a day (BID) | ORAL | 0 refills | 30.00000 days | Status: CP | PRN
Start: 2020-10-03 — End: 2020-11-04
  Filled 2020-10-05: qty 60, 30d supply, fill #0

## 2020-10-03 MED ORDER — MYCOPHENOLATE SODIUM 180 MG TABLET,DELAYED RELEASE
ORAL_TABLET | Freq: Two times a day (BID) | ORAL | 11 refills | 30 days | Status: CP
Start: 2020-10-03 — End: 2020-10-03

## 2020-10-03 MED ORDER — ACETAMINOPHEN 500 MG TABLET
ORAL_TABLET | Freq: Four times a day (QID) | ORAL | 0 refills | 7 days | Status: CP | PRN
Start: 2020-10-03 — End: ?
  Filled 2020-10-05: qty 30, 30d supply, fill #0

## 2020-10-03 MED ORDER — MG-PLUS-PROTEIN 133 MG TABLET
ORAL_TABLET | Freq: Two times a day (BID) | ORAL | 0 refills | 50 days | Status: CP
Start: 2020-10-03 — End: 2020-10-11
  Filled 2020-10-05: qty 100, 50d supply, fill #0

## 2020-10-03 MED ORDER — LORATADINE 10 MG TABLET
ORAL_TABLET | Freq: Every day | ORAL | 0 refills | 30.00000 days | PRN
Start: 2020-10-03 — End: ?

## 2020-10-03 MED ORDER — SODIUM BICARBONATE 650 MG TABLET
ORAL_TABLET | Freq: Three times a day (TID) | ORAL | 2 refills | 34.00000 days | Status: CP
Start: 2020-10-03 — End: 2020-10-05

## 2020-10-03 MED ORDER — PREDNISONE 5 MG TABLET
ORAL_TABLET | Freq: Every day | ORAL | 11 refills | 30.00000 days | Status: CP
Start: 2020-10-03 — End: 2020-10-23

## 2020-10-03 MED ORDER — TACROLIMUS 1 MG CAPSULE, IMMEDIATE-RELEASE
ORAL_CAPSULE | Freq: Two times a day (BID) | ORAL | 11 refills | 30.00000 days | Status: CP
Start: 2020-10-03 — End: 2020-11-02

## 2020-10-04 DIAGNOSIS — Z940 Kidney transplant status: Principal | ICD-10-CM

## 2020-10-04 DIAGNOSIS — D849 Immunodeficiency, unspecified: Principal | ICD-10-CM

## 2020-10-04 MED ORDER — MYCOPHENOLATE MOFETIL 250 MG CAPSULE
ORAL_CAPSULE | Freq: Two times a day (BID) | ORAL | 11 refills | 30.00000 days | Status: CP
Start: 2020-10-04 — End: 2021-10-04
  Filled 2020-10-05: qty 180, 30d supply, fill #0

## 2020-10-04 MED ORDER — FAMOTIDINE 20 MG TABLET
ORAL_TABLET | Freq: Every day | ORAL | 5 refills | 30.00000 days | Status: SS
Start: 2020-10-04 — End: 2020-10-12

## 2020-10-04 MED ORDER — CELLCEPT 250 MG CAPSULE
ORAL_CAPSULE | Freq: Two times a day (BID) | ORAL | 11 refills | 30.00000 days | Status: CP
Start: 2020-10-04 — End: 2020-10-04

## 2020-10-04 MED ORDER — DAPSONE 100 MG TABLET
ORAL_TABLET | Freq: Every day | ORAL | 5 refills | 30.00000 days | Status: CP
Start: 2020-10-04 — End: 2020-10-11
  Filled 2020-10-05: qty 30, 30d supply, fill #0

## 2020-10-05 DIAGNOSIS — Z940 Kidney transplant status: Principal | ICD-10-CM

## 2020-10-05 DIAGNOSIS — Z1159 Encounter for screening for other viral diseases: Principal | ICD-10-CM

## 2020-10-05 DIAGNOSIS — Z79899 Other long term (current) drug therapy: Principal | ICD-10-CM

## 2020-10-05 MED ORDER — TACROLIMUS 1 MG CAPSULE, IMMEDIATE-RELEASE
ORAL_CAPSULE | Freq: Two times a day (BID) | ORAL | 11 refills | 30.00000 days | Status: CP
Start: 2020-10-05 — End: 2020-10-23
  Filled 2020-10-05: qty 360, 30d supply, fill #0

## 2020-10-05 MED ORDER — POTAS AND SOD CITRATE-CITRIC ACID 550 MG-500 MG-334 MG/5 ML ORAL SOLN
TOPICAL | 5 refills | 0.00000 days | Status: CP
Start: 2020-10-05 — End: 2020-10-05
  Filled 2020-10-05: qty 30, 30d supply, fill #0

## 2020-10-05 MED ORDER — POTAS AND SOD CITRATE-CITRIC ACID 550 MG-500 MG-334 MG/5 ML ORAL SOLN: mL | 5 refills | 0 days | Status: AC

## 2020-10-05 MED ORDER — SODIUM BICARBONATE 650 MG TABLET
ORAL_TABLET | Freq: Three times a day (TID) | ORAL | 2 refills | 17 days | Status: CP
Start: 2020-10-05 — End: 2020-10-05
  Filled 2020-10-05: qty 510, 30d supply, fill #0

## 2020-10-05 MED ORDER — TRAMADOL 50 MG TABLET
ORAL_TABLET | Freq: Four times a day (QID) | ORAL | 0 refills | 7.00000 days | Status: CP | PRN
Start: 2020-10-05 — End: 2020-10-13
  Filled 2020-10-05: qty 56, 7d supply, fill #0

## 2020-10-05 MED ORDER — VALGANCICLOVIR 450 MG TABLET
ORAL_TABLET | ORAL | 2 refills | 60.00000 days | Status: SS
Start: 2020-10-05 — End: 2020-10-12
  Filled 2020-10-05: qty 30, 60d supply, fill #0

## 2020-10-05 MED FILL — DAPSONE 100 MG TABLET: 30 days supply | Qty: 30 | Fill #0 | Status: AC

## 2020-10-05 MED FILL — POLYETHYLENE GLYCOL 3350 17 GRAM/DOSE ORAL POWDER: 30 days supply | Qty: 510 | Fill #0 | Status: AC

## 2020-10-05 MED FILL — FAMOTIDINE 20 MG TABLET: 30 days supply | Qty: 30 | Fill #0 | Status: AC

## 2020-10-05 MED FILL — GABAPENTIN 100 MG CAPSULE: 12 days supply | Qty: 12 | Fill #0 | Status: AC

## 2020-10-05 MED FILL — MYCOPHENOLATE MOFETIL 250 MG CAPSULE: 30 days supply | Qty: 180 | Fill #0 | Status: AC

## 2020-10-05 MED FILL — TRAMADOL 50 MG TABLET: 7 days supply | Qty: 56 | Fill #0 | Status: AC

## 2020-10-05 MED FILL — TACROLIMUS 1 MG CAPSULE, IMMEDIATE-RELEASE: 30 days supply | Qty: 360 | Fill #0 | Status: AC

## 2020-10-05 MED FILL — DOCUSATE SODIUM 100 MG CAPSULE: 30 days supply | Qty: 60 | Fill #0 | Status: AC

## 2020-10-05 MED FILL — ACETAMINOPHEN 500 MG TABLET: ORAL | 7 days supply | Qty: 50 | Fill #0

## 2020-10-05 MED FILL — MG-PLUS-PROTEIN 133 MG TABLET: 50 days supply | Qty: 100 | Fill #0 | Status: AC

## 2020-10-05 MED FILL — ASPIRIN 81 MG TABLET,DELAYED RELEASE: 30 days supply | Qty: 30 | Fill #0 | Status: AC

## 2020-10-05 MED FILL — VALGANCICLOVIR 450 MG TABLET: 60 days supply | Qty: 30 | Fill #0 | Status: AC

## 2020-10-05 MED FILL — ACETAMINOPHEN 500 MG TABLET: 7 days supply | Qty: 50 | Fill #0 | Status: AC

## 2020-10-06 ENCOUNTER — Other Ambulatory Visit: Admit: 2020-10-06 | Discharge: 2020-10-07 | Payer: PRIVATE HEALTH INSURANCE

## 2020-10-06 DIAGNOSIS — Z940 Kidney transplant status: Principal | ICD-10-CM

## 2020-10-06 DIAGNOSIS — Z1159 Encounter for screening for other viral diseases: Principal | ICD-10-CM

## 2020-10-06 DIAGNOSIS — Z79899 Other long term (current) drug therapy: Principal | ICD-10-CM

## 2020-10-06 MED ORDER — POTASSIUM CHLORIDE ER 20 MEQ TABLET,EXTENDED RELEASE(PART/CRYST)
ORAL_TABLET | Freq: Every day | ORAL | 6 refills | 30.00000 days | Status: CP
Start: 2020-10-06 — End: 2020-10-09

## 2020-10-08 DIAGNOSIS — Z940 Kidney transplant status: Principal | ICD-10-CM

## 2020-10-08 DIAGNOSIS — D849 Immunodeficiency, unspecified: Principal | ICD-10-CM

## 2020-10-09 DIAGNOSIS — D631 Anemia in chronic kidney disease: Principal | ICD-10-CM

## 2020-10-09 DIAGNOSIS — D849 Immunodeficiency, unspecified: Principal | ICD-10-CM

## 2020-10-09 DIAGNOSIS — Z940 Kidney transplant status: Principal | ICD-10-CM

## 2020-10-09 DIAGNOSIS — N189 Chronic kidney disease, unspecified: Principal | ICD-10-CM

## 2020-10-09 MED ORDER — POTASSIUM CHLORIDE ER 20 MEQ TABLET,EXTENDED RELEASE(PART/CRYST)
ORAL_TABLET | Freq: Every day | ORAL | 11 refills | 30.00000 days
Start: 2020-10-09 — End: 2021-10-09

## 2020-10-10 ENCOUNTER — Encounter
Admit: 2020-10-10 | Discharge: 2020-10-11 | Payer: PRIVATE HEALTH INSURANCE | Attending: Nephrology | Primary: Nephrology

## 2020-10-10 DIAGNOSIS — Z48298 Encounter for aftercare following other organ transplant: Principal | ICD-10-CM

## 2020-10-10 DIAGNOSIS — Z792 Long term (current) use of antibiotics: Principal | ICD-10-CM

## 2020-10-10 DIAGNOSIS — Z7982 Long term (current) use of aspirin: Principal | ICD-10-CM

## 2020-10-10 DIAGNOSIS — Z79890 Hormone replacement therapy (postmenopausal): Principal | ICD-10-CM

## 2020-10-10 DIAGNOSIS — D631 Anemia in chronic kidney disease: Principal | ICD-10-CM

## 2020-10-10 DIAGNOSIS — J9811 Atelectasis: Principal | ICD-10-CM

## 2020-10-10 DIAGNOSIS — Z7952 Long term (current) use of systemic steroids: Principal | ICD-10-CM

## 2020-10-10 DIAGNOSIS — Z79899 Other long term (current) drug therapy: Principal | ICD-10-CM

## 2020-10-10 DIAGNOSIS — E038 Other specified hypothyroidism: Principal | ICD-10-CM

## 2020-10-10 DIAGNOSIS — Z1159 Encounter for screening for other viral diseases: Principal | ICD-10-CM

## 2020-10-10 DIAGNOSIS — Z940 Kidney transplant status: Principal | ICD-10-CM

## 2020-10-10 DIAGNOSIS — N189 Chronic kidney disease, unspecified: Principal | ICD-10-CM

## 2020-10-10 DIAGNOSIS — E7204 Cystinosis: Principal | ICD-10-CM

## 2020-10-10 DIAGNOSIS — D849 Immunodeficiency, unspecified: Principal | ICD-10-CM

## 2020-10-10 DIAGNOSIS — R042 Hemoptysis: Principal | ICD-10-CM

## 2020-10-11 ENCOUNTER — Ambulatory Visit
Admit: 2020-10-11 | Discharge: 2020-10-13 | Disposition: A | Discharge status: Home / Self Care | Payer: PRIVATE HEALTH INSURANCE | Admitting: Student in an Organized Health Care Education/Training Program

## 2020-10-11 ENCOUNTER — Encounter: Admit: 2020-10-11 | Discharge: 2020-10-11 | Payer: PRIVATE HEALTH INSURANCE

## 2020-10-11 ENCOUNTER — Encounter: Admit: 2020-10-11 | Discharge: 2020-10-11 | Payer: PRIVATE HEALTH INSURANCE | Attending: Surgery | Primary: Surgery

## 2020-10-11 ENCOUNTER — Other Ambulatory Visit (HOSPITAL_COMMUNITY): Payer: Self-pay

## 2020-10-11 DIAGNOSIS — D849 Immunodeficiency, unspecified: Principal | ICD-10-CM

## 2020-10-11 DIAGNOSIS — R519 Intractable episodic headache, unspecified headache type: Principal | ICD-10-CM

## 2020-10-11 DIAGNOSIS — E039 Hypothyroidism, unspecified: Principal | ICD-10-CM

## 2020-10-11 DIAGNOSIS — Z940 Kidney transplant status: Principal | ICD-10-CM

## 2020-10-11 DIAGNOSIS — T371X5A Adverse effect of antimycobacterial drugs, initial encounter: Principal | ICD-10-CM

## 2020-10-11 DIAGNOSIS — D592 Drug-induced nonautoimmune hemolytic anemia: Principal | ICD-10-CM

## 2020-10-11 DIAGNOSIS — Z7952 Long term (current) use of systemic steroids: Principal | ICD-10-CM

## 2020-10-11 DIAGNOSIS — Z48298 Encounter for aftercare following other organ transplant: Principal | ICD-10-CM

## 2020-10-11 DIAGNOSIS — H5203 Hypermetropia, bilateral: Principal | ICD-10-CM

## 2020-10-11 DIAGNOSIS — N189 Chronic kidney disease, unspecified: Principal | ICD-10-CM

## 2020-10-11 DIAGNOSIS — E7204 Cystinosis: Principal | ICD-10-CM

## 2020-10-11 DIAGNOSIS — D84821 Immunodeficiency due to drugs: Principal | ICD-10-CM

## 2020-10-11 DIAGNOSIS — N184 Chronic kidney disease, stage 4 (severe): Principal | ICD-10-CM

## 2020-10-11 DIAGNOSIS — Z87448 Personal history of other diseases of urinary system: Principal | ICD-10-CM

## 2020-10-11 DIAGNOSIS — D631 Anemia in chronic kidney disease: Principal | ICD-10-CM

## 2020-10-11 DIAGNOSIS — N185 Chronic kidney disease, stage 5: Principal | ICD-10-CM

## 2020-10-11 DIAGNOSIS — D649 Anemia, unspecified: Principal | ICD-10-CM

## 2020-10-11 DIAGNOSIS — Z20822 Contact with and (suspected) exposure to covid-19: Principal | ICD-10-CM

## 2020-10-11 DIAGNOSIS — Z79899 Other long term (current) drug therapy: Principal | ICD-10-CM

## 2020-10-11 DIAGNOSIS — G932 Benign intracranial hypertension: Principal | ICD-10-CM

## 2020-10-11 MED ORDER — MG-PLUS-PROTEIN 133 MG TABLET
ORAL_TABLET | Freq: Two times a day (BID) | ORAL | 5 refills | 50 days | Status: CP
Start: 2020-10-11 — End: ?
  Filled 2020-11-26: qty 100, 50d supply, fill #0

## 2020-10-11 MED ORDER — ATOVAQUONE 750 MG/5 ML ORAL SUSPENSION
Freq: Every day | ORAL | 5 refills | 30.00000 days | Status: CP
Start: 2020-10-11 — End: 2020-11-10
  Filled 2020-10-11: qty 300, 30d supply, fill #0

## 2020-10-11 MED FILL — ATOVAQUONE 750 MG/5 ML ORAL SUSPENSION: 30 days supply | Qty: 300 | Fill #0 | Status: AC

## 2020-10-12 MED ORDER — VALGANCICLOVIR 450 MG TABLET
ORAL_TABLET | Freq: Every day | ORAL | 2 refills | 30.00000 days | Status: CP
Start: 2020-10-12 — End: 2021-01-10

## 2020-10-12 MED ORDER — FAMOTIDINE 20 MG TABLET
ORAL_TABLET | Freq: Every day | ORAL | 5 refills | 30.00000 days | PRN
Start: 2020-10-12 — End: 2020-11-11

## 2020-10-14 MED ORDER — VALGANCICLOVIR 450 MG TABLET
ORAL_TABLET | Freq: Every day | ORAL | 11 refills | 30 days | Status: CP
Start: 2020-10-14 — End: 2020-10-24

## 2020-10-15 DIAGNOSIS — D849 Immunodeficiency, unspecified: Principal | ICD-10-CM

## 2020-10-15 DIAGNOSIS — Z940 Kidney transplant status: Principal | ICD-10-CM

## 2020-10-16 DIAGNOSIS — Z1159 Encounter for screening for other viral diseases: Principal | ICD-10-CM

## 2020-10-16 DIAGNOSIS — Z940 Kidney transplant status: Principal | ICD-10-CM

## 2020-10-16 DIAGNOSIS — D849 Immunodeficiency, unspecified: Principal | ICD-10-CM

## 2020-10-16 DIAGNOSIS — Z79899 Other long term (current) drug therapy: Principal | ICD-10-CM

## 2020-10-18 DIAGNOSIS — D631 Anemia in chronic kidney disease: Principal | ICD-10-CM

## 2020-10-18 DIAGNOSIS — Z940 Kidney transplant status: Principal | ICD-10-CM

## 2020-10-18 DIAGNOSIS — N189 Chronic kidney disease, unspecified: Principal | ICD-10-CM

## 2020-10-18 DIAGNOSIS — D849 Immunodeficiency, unspecified: Principal | ICD-10-CM

## 2020-10-18 DIAGNOSIS — D649 Anemia, unspecified: Principal | ICD-10-CM

## 2020-10-21 MED ORDER — SODIUM ZIRCONIUM CYCLOSILICATE 5 GRAM ORAL POWDER PACKET: 10 g | packet | Freq: Two times a day (BID) | 5 refills | 2 days | Status: AC

## 2020-10-21 MED ORDER — SODIUM ZIRCONIUM CYCLOSILICATE 5 GRAM ORAL POWDER PACKET
PACK | Freq: Two times a day (BID) | ORAL | 5 refills | 2.00000 days | Status: CP
Start: 2020-10-21 — End: 2020-10-23

## 2020-10-22 DIAGNOSIS — D849 Immunodeficiency, unspecified: Principal | ICD-10-CM

## 2020-10-22 DIAGNOSIS — Z940 Kidney transplant status: Principal | ICD-10-CM

## 2020-10-23 DIAGNOSIS — D849 Immunodeficiency, unspecified: Principal | ICD-10-CM

## 2020-10-23 DIAGNOSIS — Z940 Kidney transplant status: Principal | ICD-10-CM

## 2020-10-23 DIAGNOSIS — Z0000 Encounter for general adult medical examination without abnormal findings: Principal | ICD-10-CM

## 2020-10-23 MED ORDER — TACROLIMUS 1 MG CAPSULE, IMMEDIATE-RELEASE
ORAL_CAPSULE | Freq: Two times a day (BID) | ORAL | 11 refills | 30.00000 days | Status: CP
Start: 2020-10-23 — End: 2021-10-23

## 2020-10-24 ENCOUNTER — Encounter: Admit: 2020-10-24 | Discharge: 2020-10-24 | Payer: PRIVATE HEALTH INSURANCE

## 2020-10-24 ENCOUNTER — Encounter
Admit: 2020-10-24 | Discharge: 2020-10-24 | Payer: PRIVATE HEALTH INSURANCE | Attending: Nephrology | Primary: Nephrology

## 2020-10-24 ENCOUNTER — Other Ambulatory Visit (HOSPITAL_COMMUNITY): Payer: Self-pay | Admitting: Nephrology

## 2020-10-24 DIAGNOSIS — Z1159 Encounter for screening for other viral diseases: Principal | ICD-10-CM

## 2020-10-24 DIAGNOSIS — Z79899 Other long term (current) drug therapy: Principal | ICD-10-CM

## 2020-10-24 DIAGNOSIS — Z7952 Long term (current) use of systemic steroids: Principal | ICD-10-CM

## 2020-10-24 DIAGNOSIS — J9811 Atelectasis: Principal | ICD-10-CM

## 2020-10-24 DIAGNOSIS — Z940 Kidney transplant status: Principal | ICD-10-CM

## 2020-10-24 DIAGNOSIS — Z7982 Long term (current) use of aspirin: Principal | ICD-10-CM

## 2020-10-24 DIAGNOSIS — N189 Chronic kidney disease, unspecified: Principal | ICD-10-CM

## 2020-10-24 DIAGNOSIS — D849 Immunodeficiency, unspecified: Principal | ICD-10-CM

## 2020-10-24 DIAGNOSIS — R042 Hemoptysis: Principal | ICD-10-CM

## 2020-10-24 DIAGNOSIS — E039 Hypothyroidism, unspecified: Principal | ICD-10-CM

## 2020-10-24 DIAGNOSIS — Z0000 Encounter for general adult medical examination without abnormal findings: Principal | ICD-10-CM

## 2020-10-24 DIAGNOSIS — D649 Anemia, unspecified: Principal | ICD-10-CM

## 2020-10-24 DIAGNOSIS — Z79890 Hormone replacement therapy (postmenopausal): Principal | ICD-10-CM

## 2020-10-24 DIAGNOSIS — Z48298 Encounter for aftercare following other organ transplant: Principal | ICD-10-CM

## 2020-10-24 DIAGNOSIS — E7204 Cystinosis: Principal | ICD-10-CM

## 2020-10-24 DIAGNOSIS — E038 Other specified hypothyroidism: Principal | ICD-10-CM

## 2020-10-24 MED ORDER — VALGANCICLOVIR 450 MG TABLET
ORAL_TABLET | Freq: Every day | ORAL | 11 refills | 30.00000 days | Status: CP
Start: 2020-10-24 — End: 2021-10-24
  Filled 2020-10-25: qty 60, 30d supply, fill #0

## 2020-10-24 MED ORDER — CHOLECALCIFEROL (VITAMIN D3) 50 MCG (2,000 UNIT) TABLET
ORAL_TABLET | Freq: Every day | ORAL | 11 refills | 30 days | Status: CP
Start: 2020-10-24 — End: ?

## 2020-10-24 NOTE — Unmapped (Signed)
AOBP performed R  arm, medium cuff.  Average - 123/74, p 77   1st reading - 118/74, p 72   2nd reading - 124/75, p 79    3rd reading- 127/74, p 81      Patient labs drawn in room prior to leaving Nephrology clinic

## 2020-10-24 NOTE — Unmapped (Signed)
Transplant Nephrology Clinic Visit    Assessment and Plan  Charles Huff is a 29 y.o. 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   - UPC 0.525 today is consistent with recent values. Proteinuria may be partially from native and first transplant kidneys.  - cPRA pre-transplant was 0%, no DSA screens have been done following his second transplant      Immunosuppression management.   - Tacrolimus trough 8.6 10/22/20 (target 6-10).    - Continue tacrolimus 4 mg bid.    - Continue CellCept 750 mg bid. Prior to second transplant he was on Myfortic 180 mg bid with dose limited by leukopenia. WBC is now 2.6.   - Continue prednisone 5 mg daily.      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  - Continue atovaquone due to history of sulfa allergy and dapsone associated hemolysis   - Will monitor EBV viral load due to high risk EBV status    Immunization status:  - COVID-19 vaccine x 3 (05/25/20, 06/15/20, 10/18/20), had COVID-19 infection November 2020.   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  - Will continue Tricitrates 60 mg bid    Cystinosis.   - Continue Cystagon 450 mg QID.   - He plans to start taking eye gtts. Optho consult confirmed diffuse stromal crystals bilaterally   - Will check WBC cystine periodically (most recent 0.36 on 11/09/19).   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  - Continue Synthroid 50 mcg.   - TSH 2.231 on 05/09/20  - He is clinically euthyroid      Anemia, improved.   - Course complicated by probable dapsone induced hmolysis   - Hemoglobin is up to 9.6 today   - Aranesp 200 mcg last administered on 10/10/20     Leukopenia.    - WBC is 2.6.   - Leukopenia limited mycophenolate dosing after first transplant. Will monitor WBC on higher dose anti-metabolite therapy (CellCept 750 mg bid versus Myfortic 180 bid)     Post-Operative Hemoptysis, Right basilar consolidation  - Hemoptysis has not recurred.  - PE was felt to be unlikely  - Findings in the hospital were felt consistent with post-operative atelectasis  - Will have low threshold for repeat CXR with any persistent symptoms.      History of COVID-19 infection, 08/2019.   - Initially had palpitations that resolved.  - No long-term sequelae.   - S/P COVID vaccine x 3 after infection.   - He  is aware of the limited efficacy of the COVID-19 vaccine    Follow up.   - My clinic on 11/09/20  - Labs 2 days per week.     History of Present Illness:     Charles Huff is a 29 y.o. 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 He was hospitalized 10/11/20-10/13/20 after presenting with hemoglobin 6.2, transient hypoxemia, and severe headache. Methemoblobin level was 10.0 and haptoglobin <1. His  anemia was felt to be due to a combination of CKD and dapsone. He was transfused 2 units pRBCs and switched to atovaquone prophylaxis from Dapsone. Brain MRI revealed a Chiari-1 malformation with slight distention of the optic nerve sheath. Neurology was consulted and they felt his headache was due to a migraine and that the Chiari-1 malformation was not contributory to his headaches. They did not suggest neurology follow up was necessary.    He presents today stating his headaches have resolved. . Incisional pain is now minimal. He denies dysuria, gross hematuria, or fever. He denies cough or recurrence of  Hemoptysis, dyspnea on exertion, chest pain or edema. He denies nausea or vomiting. He has mild intermittent diarrhea and he has stopped taking Colace.      Transplant History:  - Native kidney disease - cystinosis  - Transplant #1, 08/05/16 living donor transplant:  ?? Father living donor  ?? Delayed graft function   ?? Creatinine never below 3.0 mg/dL. Creatinine baseline prior to second transplant 3.7-4.5 mg/dL.    ?? Induction - Campath and Solumedrol\  ?? Maintenance therapy tacrolimus and Myfortic.  ?? Serologies CMV D+/R-, EBV D+/R- prior to first transplant. EBV never seroconverted.    ?? Kidney biopsies on 08/14/16, 09/04/2016, and 10/10/2016 revealed ATN, atypical epithelial cells with intracellular vacuolization, electron microscopic findings of unusual mitochondria, and findings of early calcineurin inhibitor toxicity.   ?? Kidney biopsy 08/10/2017 revealed diffuse IF/TA without other abnormalities.   ?? No rejection or DSAs after first transplant   ?? Complication: CMV infection  ?? Urothelial thickening with an indeterminate 7 mm nodular focus concerning for possible urothelial lesion. This was almost completely resolved on follow-up US 12/02/2017 and not noted on 11/17/18 or 11/09/19.    - Transplant # 2, deceased donor transplant October 31, 2020:  ?? Pre-transplant cPRA 0%  ?? KDPI 13%, cold ischemia time  ?? CMV D-R+, EBV D+R-  ?? Implantation biopsy 2020/10/31 - no pathology noted  ?? Induction agent - Campath  ?? Maintenance IS on discharge - tacrolimus, CellCept, prednisone  ?? No delayed graft function  ?? Complicated by atelectasis with cough, hemptysis  ?? Readmitted for dapsone associated anemia on 10/13/20    Review of Systems    All other systems are reviewed and are negative. A 10 systems review was completed.    Medications    Current Outpatient Medications   Medication Sig Dispense Refill   ??? acetaminophen (TYLENOL) 500 MG tablet Take 1-2 tablets (500-1,000 mg total) by mouth every six (6) hours as needed for pain or fever. 50 tablet 0   ??? aspirin (ECOTRIN) 81 MG tablet Take 1 tablet (81 mg total) by mouth daily. 30 tablet 0   ??? atovaquone (MEPRON) 750 mg/5 mL suspension Take 10 mL (1,500 mg total) by mouth daily. 300 mL 5   ??? cholecalciferol, vitamin D3, (CHOLECALCIFEROL) 1,000 unit tablet Take 4,000 Units by mouth daily.     ???  cysteamine bitartrate (CYSTAGON) 150 mg cap TAKE 3 CAPSULES BY MOUTH 4 TIMES A DAY 360 capsule 8   ??? docusate sodium (COLACE) 100 MG capsule Take 1 capsule (100 mg total) by mouth two (2) times a day as needed for constipation. 60 capsule 0   ??? famotidine (PEPCID) 20 MG tablet Take 1 tablet (20 mg total) by mouth daily as needed for heartburn. 30 tablet 5   ??? gabapentin (NEURONTIN) 100 MG capsule Take 1 capsule (100 mg total) by mouth nightly. 12 capsule 0   ??? levothyroxine (SYNTHROID) 50 MCG tablet TAKE ONE TABLET AT BEDTIME 90 each 5   ??? loratadine (CLARITIN) 10 mg tablet Take 1 tablet (10 mg total) by mouth daily as needed for allergies. 30 tablet 0   ??? magnesium oxide-Mg AA chelate (MAGNESIUM, AMINO ACID CHELATE,) 133 mg Tab Take 1 tablet by mouth two (2) times a day. 100 tablet 5   ??? mycophenolate (CELLCEPT) 250 mg capsule Take 3 capsules (750 mg total) by mouth Two (2) times a day. 180 capsule 11   ??? polyethylene glycol (GLYCOLAX) 17 gram/dose powder Mix 1 capful (17 grams) in 4-8 ounces of water,juice or tea and drink daily as needed for constipation 510 g 0   ??? potassium & sodium citrate-citric acid (TRICITRATES) 550-500-334 mg/5 mL Soln Take 60mL by mouth 3 times a day (after meals) for 1 week then take 60mL by mouth twice daily. 473 mL 5   ??? tacrolimus (PROGRAF) 1 MG capsule Take 4 capsules (4 mg total) by mouth two (2) times a day. 240 capsule 11   ??? valGANciclovir (VALCYTE) 450 mg tablet Take 2 tablets (900 mg total) by mouth daily. 60 tablet 11     No current facility-administered medications for this visit.       Physical Exam    BP 123/74 (BP Site: R Arm, BP Position: Sitting, BP Cuff Size: Medium)  - Pulse 77  - Temp 36.5 ??C (97.7 ??F) (Temporal)  - Wt 62.1 kg (137 lb)  - BMI 20.84 kg/m??   General: Patient is a pleasant male in no apparent distress.  Eyes: Sclera anicteric.  Neck: Supple without LAD/JVD/bruits.  Lungs: Clear to auscultation bilaterally, no wheezes/rales/rhonchi.  Cardiovascular: Regular rate and rhythm without murmurs, rubs or gallops.  Abdomen: Incision site without drainage or erythema, expected post-op tenderness  Extremities: Without edema, joints without evidence of synovitis  Skin: Without rash  Neurological: Grossly nonfocal.  Psychiatric: Mood and affect appropriate.    Laboratory Results and Imaging Reviewed in Epic

## 2020-10-25 ENCOUNTER — Encounter: Admit: 2020-10-25 | Discharge: 2020-10-26 | Payer: PRIVATE HEALTH INSURANCE

## 2020-10-25 DIAGNOSIS — D849 Immunodeficiency, unspecified: Principal | ICD-10-CM

## 2020-10-25 DIAGNOSIS — Z940 Kidney transplant status: Principal | ICD-10-CM

## 2020-10-25 DIAGNOSIS — D649 Anemia, unspecified: Principal | ICD-10-CM

## 2020-10-25 MED FILL — VALGANCICLOVIR 450 MG TABLET: 30 days supply | Qty: 60 | Fill #0 | Status: AC

## 2020-10-29 DIAGNOSIS — Z940 Kidney transplant status: Principal | ICD-10-CM

## 2020-10-29 DIAGNOSIS — Z114 Encounter for screening for human immunodeficiency virus [HIV]: Principal | ICD-10-CM

## 2020-10-29 DIAGNOSIS — D849 Immunodeficiency, unspecified: Principal | ICD-10-CM

## 2020-10-29 DIAGNOSIS — Z1159 Encounter for screening for other viral diseases: Principal | ICD-10-CM

## 2020-10-29 DIAGNOSIS — Z79899 Other long term (current) drug therapy: Principal | ICD-10-CM

## 2020-10-29 MED ORDER — ASPIRIN 81 MG TABLET,DELAYED RELEASE
ORAL_TABLET | Freq: Every day | ORAL | 0 refills | 30.00000 days | Status: CN
Start: 2020-10-29 — End: 2020-11-28

## 2020-10-29 MED ORDER — TACROLIMUS 1 MG CAPSULE, IMMEDIATE-RELEASE: 4 mg | capsule | Freq: Two times a day (BID) | 11 refills | 30 days | Status: AC

## 2020-10-29 MED ORDER — TACROLIMUS 1 MG CAPSULE, IMMEDIATE-RELEASE
ORAL_CAPSULE | Freq: Two times a day (BID) | ORAL | 11 refills | 30.00000 days | Status: CP
Start: 2020-10-29 — End: 2021-10-29
  Filled 2020-10-31: qty 240, 30d supply, fill #0

## 2020-10-30 ENCOUNTER — Encounter: Admit: 2020-10-30 | Discharge: 2020-10-31 | Payer: PRIVATE HEALTH INSURANCE

## 2020-10-30 ENCOUNTER — Other Ambulatory Visit (HOSPITAL_COMMUNITY): Payer: Self-pay | Admitting: Nephrology

## 2020-10-30 DIAGNOSIS — D849 Immunodeficiency, unspecified: Principal | ICD-10-CM

## 2020-10-30 DIAGNOSIS — Z940 Kidney transplant status: Principal | ICD-10-CM

## 2020-10-30 DIAGNOSIS — Z114 Encounter for screening for human immunodeficiency virus [HIV]: Principal | ICD-10-CM

## 2020-10-30 DIAGNOSIS — Z1159 Encounter for screening for other viral diseases: Principal | ICD-10-CM

## 2020-10-30 DIAGNOSIS — Z79899 Other long term (current) drug therapy: Principal | ICD-10-CM

## 2020-10-30 LAB — CBC W/ AUTO DIFF
BASOPHILS ABSOLUTE COUNT: 0.1 10*9/L (ref 0.0–0.1)
BASOPHILS RELATIVE PERCENT: 2 %
EOSINOPHILS ABSOLUTE COUNT: 0.1 10*9/L (ref 0.0–0.7)
EOSINOPHILS RELATIVE PERCENT: 1.9 %
HEMATOCRIT: 32.2 % — ABNORMAL LOW (ref 38.0–50.0)
HEMOGLOBIN: 10.6 g/dL — ABNORMAL LOW (ref 13.5–17.5)
LYMPHOCYTES ABSOLUTE COUNT: 0 10*9/L — ABNORMAL LOW (ref 0.7–4.0)
LYMPHOCYTES RELATIVE PERCENT: 1.2 %
MEAN CORPUSCULAR HEMOGLOBIN CONC: 32.7 g/dL (ref 30.0–36.0)
MEAN CORPUSCULAR HEMOGLOBIN: 31.4 pg (ref 26.0–34.0)
MEAN CORPUSCULAR VOLUME: 95.9 fL — ABNORMAL HIGH (ref 81.0–95.0)
MEAN PLATELET VOLUME: 7.3 fL (ref 7.0–10.0)
MONOCYTES ABSOLUTE COUNT: 0.2 10*9/L (ref 0.1–1.0)
MONOCYTES RELATIVE PERCENT: 6.1 %
NEUTROPHILS ABSOLUTE COUNT: 2.4 10*9/L (ref 1.7–7.7)
NEUTROPHILS RELATIVE PERCENT: 88.8 %
NUCLEATED RED BLOOD CELLS: 0 /100{WBCs} (ref <=4)
PLATELET COUNT: 200 10*9/L (ref 150–450)
RED BLOOD CELL COUNT: 3.36 10*12/L — ABNORMAL LOW (ref 4.32–5.72)
RED CELL DISTRIBUTION WIDTH: 16.3 % — ABNORMAL HIGH (ref 12.0–15.0)
WBC ADJUSTED: 2.8 10*9/L — ABNORMAL LOW (ref 3.5–10.5)

## 2020-10-30 LAB — RENAL FUNCTION PANEL
ALBUMIN: 4.2 g/dL (ref 3.4–5.0)
ANION GAP: 8 mmol/L (ref 5–14)
BLOOD UREA NITROGEN: 23 mg/dL (ref 9–23)
BUN / CREAT RATIO: 15
CALCIUM: 9.5 mg/dL (ref 8.7–10.4)
CHLORIDE: 112 mmol/L — ABNORMAL HIGH (ref 98–107)
CO2: 20 mmol/L (ref 20.0–31.0)
CREATININE: 1.52 mg/dL — ABNORMAL HIGH
EGFR CKD-EPI AA MALE: 72 mL/min/{1.73_m2} (ref >=60)
EGFR CKD-EPI NON-AA MALE: 62 mL/min/{1.73_m2} (ref >=60)
GLUCOSE RANDOM: 96 mg/dL (ref 70–179)
PHOSPHORUS: 6.3 mg/dL — ABNORMAL HIGH (ref 2.4–5.1)
POTASSIUM: 4 mmol/L (ref 3.4–4.5)
SODIUM: 140 mmol/L (ref 135–145)

## 2020-10-30 LAB — TACROLIMUS LEVEL: TACROLIMUS BLOOD: 9.9 ng/mL

## 2020-10-30 LAB — MAGNESIUM: MAGNESIUM: 1.7 mg/dL (ref 1.6–2.6)

## 2020-10-30 MED ORDER — ASPIRIN 81 MG TABLET,DELAYED RELEASE: 81 mg | tablet | Freq: Every day | 3 refills | 90 days | Status: AC

## 2020-10-30 MED ORDER — ASPIRIN 81 MG TABLET,DELAYED RELEASE
ORAL_TABLET | Freq: Every day | ORAL | 3 refills | 90.00000 days | Status: CP
Start: 2020-10-30 — End: 2020-10-30
  Filled 2020-10-31: qty 90, 90d supply, fill #0

## 2020-10-30 NOTE — Unmapped (Signed)
Patient spoke with another team member today to report refills will come from [facility].  Reaching out to clinic for new tacrolimus rx.  Reaching out to triage for transfer/test claims.  Onboarding/welcome call set up for this week pending paid claims.

## 2020-10-30 NOTE — Unmapped (Signed)
The following medications are onboarded in this note:  1. Atovaquone - cost at [facility] in Dec $35, rts at [facility] until 11/03/2020   2. Mycophenolate - $10 copay   3. Tacrolimus - $10 copay   4. Valganciclovir - cost at [facility] in Dec $20, rts at [facility] until 11/16/2020     [facility]   Patient Onboarding/Medication Counseling    Mr.Charles Huff is a 30 y.o. male with kidney transplant who I am counseling today on continuation of therapy.  I am speaking to the patient.    Was a Nurse, learning disability used for this call? No    Verified patient's date of birth / HIPAA.    Specialty medication(s) to be sent: Transplant: mycophenolate mofetil 250mg , tacrolimus 1mg  and Atovaquone suspension      Non-specialty medications/supplies to be sent: asa      Medications not needed at this time: no other meds needed at this time.  Pt wants call back in 2 weeks on valcyte. States all other meds come from otc, local pharmacy, or no longer taking         The patient declined counseling on medication administration, missed dose instructions, goals of therapy, side effects and monitoring parameters, warnings and precautions, drug/food interactions and storage, handling precautions, and disposal because they have taken the medication previously. The information in the declined sections below are for informational purposes only and was not discussed with patient.       Mepron (atovaquone)    Medication & Administration     Dosage: take 10ml (1500mg  total) by mouth daily    Administration:   ??? Shake gently before use  ??? Take with food  ??? Measure liquid carefully using measuring device provided with drug    Adherence/Missed dose instructions:  ??? Take a missed dose as soon as you think about it  ??? If it is close to the time for next dose, skip missed dose and resume normal schedule  ??? Do not take 2 doses at the same time or extra doses    Goals of Therapy     ??? To prevent/cure Pneumocystis jirovecii pneumonia (PJP)    Side Effects & Monitoring Parameters     ??? Common side effects  ??? Headache  ??? Nausea, vomiting, diarrhea, abdominal pain  ??? Skin rash  ??? Trouble sleeping  ??? Muscle pain or ache  ??? Flu-like symptoms- Stuffy or runny nose, cough, fever    ??? The following side effects should be reported to the provider:  ??? Allergic reaction  ??? Signs of infection  ??? Depression  ??? Thrush  ??? Dark urine, fatigue, lack of appetite, abdominal pain, light colored stool, vomiting, yellow skin/eyes    ??? Monitoring Parameters  ??? Hepatic function tests     Contraindications, Warnings, & Precautions     ??? Use caution in patients with severe hepatic impairment  ??? Use caution in elderly patients    Drug/Food Interactions     ??? Medication list reviewed in Epic. The patient was instructed to inform the care team before taking any new medications or supplements. no interactions noted that clinic is not already monitoring.    Storage, Handling Precautions, & Disposal     ??? Store at room temperature in dry location  ??? Keep away from children and pets  The patient declined counseling on medication administration, missed dose instructions, goals of therapy, side effects and monitoring parameters, warnings and precautions, drug/food interactions and storage, handling precautions, and  disposal because they have taken the medication previously. The information in the declined sections below are for informational purposes only and was not discussed with patient.     Cellcept (mycopheonlate mofetil)    Medication & Administration     Dosage: Take 3 capsules (750mg  total) by mouth two times daily    Administration: Take by mouth with or without food. Taking with food can minimize GI side effects. Swallow capsules whole, do not crush or chew.    Adherence/Missed dose instructions:  Take a missed dose as soon as you remember it . If it is close to the time of your next dose, skip the missed dose and resume your normal schedule.Never take 2 doses to try and catch up from a missed dose.    Goals of Therapy     Prevent organ rejection    Side Effects & Monitoring Parameters     ??? Feeling tired or weak  ??? Shakiness  ??? Trouble sleeping  ??? Diarrhea, abdominal pain, nausea, vomiting, constipation or decreased appetite  ??? Decreases in blood counts   ??? Back or joint pain  ??? Hypertension or hypotension  ??? High blood sugar  ??? Headache  ??? Skin rash    The following side effects should be reported to the provider:  ??? Reduced immune function ??? report signs of infection such as fever; chills; body aches; very bad sore throat; ear or sinus pain; cough; more sputum or change in color of sputum; pain with passing urine; wound that will not heal, etc.  Also at a slightly higher risk of some malignancies (mainly skin and blood cancers) due to this reduced immune function.  ??? Allergic reaction (rash, hives, swelling, shortness of breath)  ??? High blood sugar (confusion, feeling sleepy, more thirst, more hungry, passing urine more often, flushing, fast breathing, or breath that smells like fruit)  ??? Electrolyte issues (mood changes, confusion, muscle pain or weakness, a heartbeat that does not feel normal, seizures, not hungry, or very bad upset stomach or throwing up)  ??? High or low blood pressure (bad headache or dizziness, passing out, or change in eyesight)  ??? Kidney issues (unable to pass urine, change in how much urine is passed, blood in the urine, or a big weight gain)  ??? Skin (oozing, heat, swelling, redness, or pain), UTI and other infections   ??? Chest pain or pressure  ??? Abnormal heartbeat  ??? Unexplained bleeding or bruising  ??? Abnormal burning, numbness, or tingling  ??? Muscle cramps,  ??? Yellowing of skin or eyes    Monitoring parameters  ??? Pregnancy   ??? CBC   ??? Renal and hepatic function    Contraindications, Warnings, & Precautions     ??? *This is a REMS drug and an FDA-approved patient medication guide will be printed with each dispensation  ??? Black Box Warning: Infections   ??? Black Box Warning: Lymphoproliferative disorders - risk of development of lymphoma and skin malignancy is increased  ??? Black Box Warning: Use during pregnancy is associated with increased risks of first trimester pregnancy loss and congenital malformations.   ??? Black Box Warning: Females of reproductive potential should use contraception during treatment and for 6 weeks after therapy is discontinued  ??? CNS depression  ??? New or reactivated viral infections  ??? Neutropenia  ??? Male patients and/or their male partners should use effective contraception during treatment of the male patient and for at least 3 months after last dose.  ??? Breastfeeding  is not recommended during therapy and for 6 weeks after last dose    Drug/Food Interactions     ??? Medication list reviewed in Epic. The patient was instructed to inform the care team before taking any new medications or supplements. no interactions noted that clinic is not already monitoring.   ??? Separate doses of antacids and this medication  ??? Check with your doctor before getting any vaccinations    Storage, Handling Precautions, & Disposal     ??? Store at room temperature in a dry place  ??? This medication is considered hazardous. Wash hands after handling and store out of reach or others, including children and pets.    The patient declined counseling on medication administration, missed dose instructions, goals of therapy, side effects and monitoring parameters, warnings and precautions, drug/food interactions and storage, handling precautions, and disposal because they have taken the medication previously. The information in the declined sections below are for informational purposes only and was not discussed with patient.   Prograf (tacrolimus)    Medication & Administration     Dosage: take 4 capsules (4mg  total) by mouth twice daily - note dose confirmed with coordinator on 1/4 - pt also verified this is correct dose on 1/4    Administration:   ??? May take with or without food  ??? Take 12 hours apart    Adherence/Missed dose instructions:  ??? Take a missed dose as soon as you think about it.  ??? If it is close to the time for your next dose, skip the missed dose and go back to your normal time.  ??? Do not take 2 doses at the same time or extra doses.    Goals of Therapy     ??? To prevent organ rejection    Side Effects & Monitoring Parameters     ??? Common side effects  ??? Dizziness  ??? Fatigue  ??? Headache  ??? Stuffy nose or sore throat  ??? Nausea, vomiting, stomach pain, diarrhea, constipation  ??? Heartburn  ??? Back or joint pain  ??? Increased risk of infection    ??? The following side effects should be reported to the provider:  ??? Allergic reaction  ??? Kidney issues (change in quantity or urine passed, blood in urine, or weight gain)  ??? High blood pressure (dizziness, change in eyesight, headache)  ??? Electrolyte issues (change in mood, confusion, muscle pain, or weakness)  ??? Abnormal breathing  ??? Shakiness  ??? Unexplained bleeding or bruising (gums bleeding, blood in urine, nosebleeds, any abnormal bleeding)  ??? Signs of infection (fever, cough, wounds that will not heal)  ??? Skin changes (sores, paleness, new or changed bumps or moles)    ??? Monitoring Parameters  ??? Renal function  ??? Liver function  ??? Glucose levels  ??? Blood pressure  ??? Tacrolimus trough levels  ??? Cardiac monitoring (for QT prolongation)      Contraindications, Warnings, & Precautions     ??? Black Box Warning: Infections - immunosuppressant agents increase the risk of infection that may lead to hospitalization or death  ??? Black Box Warning: Malignancy - immunosuppressant agents may be associated with the development of malignancies that may lead to hospitalization or death  ??? Limit or avoid sun and ultraviolet light exposure, use appropriate sun protection  ??? Myocardial hypertrophy -avoid use in patients with congenital long QT syndrome  ??? Diabetes mellitus - the risk for new-onset diabetes and insulin-dependent post-transplant diabetes mellitus is increased with tacrolimus use  after transplantation  ??? GI perforation  ??? Hyperkalemia  ??? Hypertension  ??? Nephrotoxicity  ??? Neurotoxicity  ??? This is a narrow therapeutic index drug. Do not switch manufacturers without first talking to the provider.    Drug/Food Interactions     ??? Medication list reviewed in Epic. The patient was instructed to inform the care team before taking any new medications or supplements. no interactions noted that clinic is not already monitoring.   ??? Avoid alcohol  ??? Avoid grapefruit or grapefruit juice  ??? Avoid live vaccines    Storage, Handling Precautions, & Disposal     ??? Store at room temperature  ??? Keep away from children and pets  The patient declined counseling on medication administration, missed dose instructions, goals of therapy, side effects and monitoring parameters, warnings and precautions, drug/food interactions and storage, handling precautions, and disposal because they have taken the medication previously. The information in the declined sections below are for informational purposes only and was not discussed with patient.   Valcyte (valganciclovir)    Medication & Administration     Dosage:   ??? Take 2 tablets (900mg  total) by mouth daily    Administration:   ??? Take with food  ??? Swallow the pills whole, do not break, crush, or chew    Adherence/Missed dose instructions:  ??? Take a missed dose as soon as you think about it with food  ??? If it is close to your next dose, skip the missed dose and go back to your normal time.  ??? Do not take 2 doses at the same time or extra doses.  ??? Report any missed doses to coordinator    Goals of Therapy     ??? To prevent or treat CMV infection in setting of solid organ transplant    Side Effects & Monitoring Parameters   ??? Common side effects  ??? Headache  ??? Diarrhea or constipation  ??? Appetite or sleep disturbances  ??? Back, muscle, joint, or belly pain  ??? Weight loss  ??? Dizziness  ??? Muscle spasm  ??? Upset stomach or vomiting    ??? The following side effects should be reported to the provider:  ??? Allergic reaction  (rash, hives, swelling, blistered or peeling skin, shortness of breath)  ??? Infection (fever, chills, sore throat, ear/sinus pain, cough, sputum change, urinary pain, mouth sores, non-healing wounds)  ??? Bleeding (cough ground vomit, blood in urine, black/red/tarry stools, unexplained bruising or bleeding)  ??? Electrolyte problems (mood changes, confusion, weakness, abnormal heartbeat, seizures)  ??? Kidney problems (urine changes, weight gain)  ??? Yellowing skin or eyes  ??? Swelling in arms, legs, stomach  ??? Severe dizziness or passing out  ??? Eye issues (eyesight changes, pain, or irritation)  ??? Night sweats    ??? Monitoring parameters  ??? Have eye exam as directed by doctor  ??? CMV counts  ??? CBC  ??? Renal function  ??? Pregnancy test prior to initiation    Contraindications, Warnings, & Precautions   ??? BBW: severe leukopenia, neutropenia, anemia, thrombocytopenia, pancytopenia, and bone marrow failure, including aplastic anemia have been reported  ??? BBW: may cause temporary or permanent inhibition of spermatogenesis and suppression of fertilty; has the potential to cause birth defects and cancers in humans  ??? Male patients should have pregnancy test prior to initiation and use birth control for at least 30 days after discontinuation  ??? Male patients should use a barrier contraceptive while on therapy and for 90 days  after discontinuation  ??? Acute renal failure  ??? Not indicated for use in liver transplant recipients  ??? Breastfeeding is not recommended    Drug/Food Interactions   ??? Medication list reviewed in Epic. The patient was instructed to inform the care team before taking any new medications or supplements. no interactions noted that clinic is not already monitoring.   ??? Check with your doctor before getting any vaccinations (live or inactivated)    Storage, Handling Precautions, & Disposal   ??? Store at room temperature  ??? Keep away from children and pets    Current Medications (including OTC/herbals), Comorbidities and Allergies     Current Outpatient Medications   Medication Sig Dispense Refill   ??? acetaminophen (TYLENOL) 500 MG tablet Take 1-2 tablets (500-1,000 mg total) by mouth every six (6) hours as needed for pain or fever. 50 tablet 0   ??? aspirin (ECOTRIN) 81 MG tablet Take 1 tablet (81 mg total) by mouth daily. 30 tablet 0   ??? atovaquone (MEPRON) 750 mg/5 mL suspension Take 10 mL (1,500 mg total) by mouth daily. 300 mL 5   ??? cholecalciferol, vitamin D3-50 mcg, 2,000 unit,, 50 mcg (2,000 unit) tablet Take 2 tablets (100 mcg total) by mouth daily. 60 tablet 11   ??? cysteamine bitartrate (CYSTAGON) 150 mg cap TAKE 3 CAPSULES BY MOUTH 4 TIMES A DAY 360 capsule 8   ??? docusate sodium (COLACE) 100 MG capsule Take 1 capsule (100 mg total) by mouth two (2) times a day as needed for constipation. 60 capsule 0   ??? levothyroxine (SYNTHROID) 50 MCG tablet TAKE ONE TABLET AT BEDTIME 90 each 5   ??? loratadine (CLARITIN) 10 mg tablet Take 1 tablet (10 mg total) by mouth daily as needed for allergies. 30 tablet 0   ??? magnesium oxide-Mg AA chelate (MAGNESIUM, AMINO ACID CHELATE,) 133 mg Tab Take 1 tablet by mouth two (2) times a day. 100 tablet 5   ??? mycophenolate (CELLCEPT) 250 mg capsule Take 3 capsules (750 mg total) by mouth Two (2) times a day. 180 capsule 11   ??? polyethylene glycol (GLYCOLAX) 17 gram/dose powder Mix 1 capful (17 grams) in 4-8 ounces of water,juice or tea and drink daily as needed for constipation 510 g 0   ??? potassium & sodium citrate-citric acid (TRICITRATES) 550-500-334 mg/5 mL Soln Take 60mL by mouth 3 times a day (after meals) for 1 week then take 60mL by mouth twice daily. 473 mL 5   ??? tacrolimus (PROGRAF) 1 MG capsule Take 4 capsules (4 mg total) by mouth two (2) times a day. 240 capsule 11   ??? valGANciclovir (VALCYTE) 450 mg tablet Take 2 tablets (900 mg total) by mouth daily. 60 tablet 11     No current facility-administered medications for this visit.       Allergies   Allergen Reactions   ??? Ibuprofen Swelling   ??? Sulfa (Sulfonamide Antibiotics) Hives and Swelling   ??? Dapsone      Anemia, methemoglobinemia       Patient Active Problem List   Diagnosis   ??? Short stature   ??? Anemia in chronic kidney disease   ??? Cystinosis (CMS-HCC)   ??? Hypothyroidism (RAF-HCC)   ??? Hydronephrosis of kidney transplant, side unknown   ??? Aftercare following organ transplant   ??? Immunosuppressed status (CMS-HCC)   ??? Environmental allergies   ??? Deceased-donor kidney transplant recipient       Reviewed and up to date in Epic.  Appropriateness of Therapy     Is medication and dose appropriate based on diagnosis? Yes    Prescription has been clinically reviewed: Yes    Baseline Quality of Life Assessment      How many days over the past month did your transplant  keep you from your normal activities? For example, brushing your teeth or getting up in the morning. 0    Financial Information     Medication Assistance provided: None Required, may pursue for atovaquone once [facility] is able to process claim later this week    Anticipated copay of $35 atovaquone and $20 valganciclovir (based on previous fills at [facility], [facility] will test claim once insurance will fill) $10 on mycophen and tacrolimus per [facility] test claims,  reviewed with patient. Verified delivery address.    Delivery Information     Scheduled delivery date: 11/01/2020 for mycophenolate, tacrolimus and asa. 11/06/2020 for atovaquone    Expected start date: pt is currently already taking all meds    Medication will be delivered via UPS to the prescription address in [facility].  This shipment will not require a signature.      Explained the services we provide at [facility] and that each month we would call to set up refills.  Stressed importance of returning phone calls so that we could ensure they receive their medications in time each month.  Informed patient that we should be setting up refills 7-10 days prior to when they will run out of medication.  A pharmacist will reach out to perform a clinical assessment periodically.  Informed patient that a welcome packet and a drug information handout will be sent.      Patient verbalized understanding of the above information as well as how to contact the pharmacy at [phone] option 4 with any questions/concerns.  The pharmacy is open Monday through Friday 8:30am-4:30pm.  A pharmacist is available 24/7 via pager to answer any clinical questions they may have.    Patient Specific Needs     - Does the patient have any physical, cognitive, or cultural barriers? No    - Patient prefers to have medications discussed with  patient or wife     - Is the patient or caregiver able to read and understand education materials at a high school level or above? Yes    - Patient's primary language is  English     - Is the patient high risk? Yes, patient is taking a REMS drug. Medication is dispensed in compliance with REMS program    - Does the patient require a Care Management Plan? No     - Does the patient require physician intervention or other additional services (i.e. nutrition, smoking cessation, social work)? No      Thad Ranger  [facility] Specialty Pharmacist

## 2020-10-30 NOTE — Unmapped (Addendum)
[facility] Specialty Medication Onboarding    Specialty Medication: Valganciclovir 450mg  tablet  Prior Authorization: Not Required   Financial Assistance: No - copay  <$25  Final Copay/Day Supply: $10 / 30 days    Insurance Restrictions: None     Notes to Pharmacist: Patient already onboarded, but this was originally RFTS until today.    The triage team has completed the benefits investigation and has determined that the patient is able to fill this medication at [facility]. Please contact the patient to complete the onboarding or follow up with the prescribing physician as needed.

## 2020-10-30 NOTE — Unmapped (Signed)
Updated med list with appropriate tac dose 4mg . [facility] pharmacy aware of correction.

## 2020-10-31 LAB — HEPATITIS C RNA, QUANTITATIVE, PCR: HCV RNA: NOT DETECTED

## 2020-10-31 MED FILL — ASPIRIN 81 MG TABLET,DELAYED RELEASE: 90 days supply | Qty: 90 | Fill #0 | Status: AC

## 2020-10-31 MED FILL — MYCOPHENOLATE MOFETIL 250 MG CAPSULE: ORAL | 30 days supply | Qty: 180 | Fill #0

## 2020-10-31 MED FILL — TACROLIMUS 1 MG CAPSULE, IMMEDIATE-RELEASE: 30 days supply | Qty: 240 | Fill #0 | Status: AC

## 2020-10-31 MED FILL — MYCOPHENOLATE MOFETIL 250 MG CAPSULE: 30 days supply | Qty: 180 | Fill #0 | Status: AC

## 2020-11-01 DIAGNOSIS — D849 Immunodeficiency, unspecified: Principal | ICD-10-CM

## 2020-11-01 DIAGNOSIS — Z940 Kidney transplant status: Principal | ICD-10-CM

## 2020-11-01 LAB — HIV RNA, QUANTITATIVE, PCR: HIV RNA QNT RSLT: NOT DETECTED

## 2020-11-01 LAB — HEPATITIS B DNA, QUANTITATIVE, PCR: HBV DNA QUANT (MAYO): NOT DETECTED [IU]/mL

## 2020-11-02 ENCOUNTER — Encounter: Admit: 2020-11-02 | Discharge: 2020-11-03 | Payer: PRIVATE HEALTH INSURANCE

## 2020-11-02 DIAGNOSIS — Z79899 Other long term (current) drug therapy: Principal | ICD-10-CM

## 2020-11-02 DIAGNOSIS — Z940 Kidney transplant status: Principal | ICD-10-CM

## 2020-11-02 DIAGNOSIS — D849 Immunodeficiency, unspecified: Principal | ICD-10-CM

## 2020-11-02 DIAGNOSIS — Z1159 Encounter for screening for other viral diseases: Principal | ICD-10-CM

## 2020-11-02 LAB — RENAL FUNCTION PANEL
ALBUMIN: 3.8 g/dL (ref 3.4–5.0)
ANION GAP: 6 mmol/L (ref 5–14)
BLOOD UREA NITROGEN: 23 mg/dL (ref 9–23)
BUN / CREAT RATIO: 18
CALCIUM: 9 mg/dL (ref 8.7–10.4)
CHLORIDE: 112 mmol/L — ABNORMAL HIGH (ref 98–107)
CO2: 23.7 mmol/L (ref 20.0–31.0)
CREATININE: 1.3 mg/dL — ABNORMAL HIGH
EGFR CKD-EPI AA MALE: 86 mL/min/{1.73_m2} (ref >=60)
EGFR CKD-EPI NON-AA MALE: 75 mL/min/{1.73_m2} (ref >=60)
GLUCOSE RANDOM: 93 mg/dL (ref 70–179)
PHOSPHORUS: 5 mg/dL (ref 2.4–5.1)
POTASSIUM: 4.1 mmol/L (ref 3.4–4.5)
SODIUM: 142 mmol/L (ref 135–145)

## 2020-11-02 LAB — CBC W/ AUTO DIFF
BASOPHILS ABSOLUTE COUNT: 0 10*9/L (ref 0.0–0.1)
BASOPHILS RELATIVE PERCENT: 1.3 %
EOSINOPHILS ABSOLUTE COUNT: 0 10*9/L (ref 0.0–0.7)
EOSINOPHILS RELATIVE PERCENT: 1.5 %
HEMATOCRIT: 30.1 % — ABNORMAL LOW (ref 38.0–50.0)
HEMOGLOBIN: 9.9 g/dL — ABNORMAL LOW (ref 13.5–17.5)
LYMPHOCYTES ABSOLUTE COUNT: 0 10*9/L — ABNORMAL LOW (ref 0.7–4.0)
LYMPHOCYTES RELATIVE PERCENT: 1.3 %
MEAN CORPUSCULAR HEMOGLOBIN CONC: 33.1 g/dL (ref 30.0–36.0)
MEAN CORPUSCULAR HEMOGLOBIN: 31.4 pg (ref 26.0–34.0)
MEAN CORPUSCULAR VOLUME: 95.1 fL — ABNORMAL HIGH (ref 81.0–95.0)
MEAN PLATELET VOLUME: 7.5 fL (ref 7.0–10.0)
MONOCYTES ABSOLUTE COUNT: 0.1 10*9/L (ref 0.1–1.0)
MONOCYTES RELATIVE PERCENT: 5.1 %
NEUTROPHILS ABSOLUTE COUNT: 2.2 10*9/L (ref 1.7–7.7)
NEUTROPHILS RELATIVE PERCENT: 90.8 %
NUCLEATED RED BLOOD CELLS: 0 /100{WBCs} (ref <=4)
PLATELET COUNT: 158 10*9/L (ref 150–450)
RED BLOOD CELL COUNT: 3.16 10*12/L — ABNORMAL LOW (ref 4.32–5.72)
RED CELL DISTRIBUTION WIDTH: 15.7 % — ABNORMAL HIGH (ref 12.0–15.0)
WBC ADJUSTED: 2.4 10*9/L — ABNORMAL LOW (ref 3.5–10.5)

## 2020-11-02 LAB — MAGNESIUM: MAGNESIUM: 1.6 mg/dL (ref 1.6–2.6)

## 2020-11-03 LAB — TACROLIMUS LEVEL: TACROLIMUS BLOOD: 6.9 ng/mL

## 2020-11-05 DIAGNOSIS — Z79899 Other long term (current) drug therapy: Principal | ICD-10-CM

## 2020-11-05 DIAGNOSIS — E7204 Cystinosis: Principal | ICD-10-CM

## 2020-11-05 DIAGNOSIS — Z1159 Encounter for screening for other viral diseases: Principal | ICD-10-CM

## 2020-11-05 DIAGNOSIS — Z940 Kidney transplant status: Principal | ICD-10-CM

## 2020-11-05 DIAGNOSIS — D849 Immunodeficiency, unspecified: Principal | ICD-10-CM

## 2020-11-05 LAB — CBC W/ DIFFERENTIAL
BANDED NEUTROPHILS ABSOLUTE COUNT: 0 10*3/uL (ref 0.0–0.1)
BASOPHILS ABSOLUTE COUNT: 0 10*3/uL (ref 0.0–0.2)
BASOPHILS RELATIVE PERCENT: 1 %
EOSINOPHILS ABSOLUTE COUNT: 0 10*3/uL (ref 0.0–0.4)
EOSINOPHILS RELATIVE PERCENT: 1 %
HEMATOCRIT: 32.1 % — ABNORMAL LOW (ref 37.5–51.0)
HEMOGLOBIN: 10.5 g/dL — ABNORMAL LOW (ref 13.0–17.7)
IMMATURE GRANULOCYTES: 1 %
LYMPHOCYTES ABSOLUTE COUNT: 0.1 10*3/uL — ABNORMAL LOW (ref 0.7–3.1)
LYMPHOCYTES RELATIVE PERCENT: 2 %
MEAN CORPUSCULAR HEMOGLOBIN CONC: 32.7 g/dL (ref 31.5–35.7)
MEAN CORPUSCULAR HEMOGLOBIN: 31.1 pg (ref 26.6–33.0)
MEAN CORPUSCULAR VOLUME: 95 fL (ref 79–97)
MONOCYTES ABSOLUTE COUNT: 0.1 10*3/uL (ref 0.1–0.9)
MONOCYTES RELATIVE PERCENT: 5 %
NEUTROPHILS ABSOLUTE COUNT: 2.8 10*3/uL (ref 1.4–7.0)
NEUTROPHILS RELATIVE PERCENT: 90 %
PLATELET COUNT: 174 10*3/uL (ref 150–450)
RED BLOOD CELL COUNT: 3.38 x10E6/uL — ABNORMAL LOW (ref 4.14–5.80)
RED CELL DISTRIBUTION WIDTH: 14.5 % (ref 11.6–15.4)
WHITE BLOOD CELL COUNT: 3 10*3/uL — ABNORMAL LOW (ref 3.4–10.8)

## 2020-11-05 MED FILL — ATOVAQUONE 750 MG/5 ML ORAL SUSPENSION: ORAL | 30 days supply | Qty: 300 | Fill #0

## 2020-11-06 DIAGNOSIS — Z940 Kidney transplant status: Principal | ICD-10-CM

## 2020-11-06 DIAGNOSIS — D849 Immunodeficiency, unspecified: Principal | ICD-10-CM

## 2020-11-06 LAB — RENAL FUNCTION PANEL
ALBUMIN: 4.5 g/dL (ref 4.1–5.2)
BLOOD UREA NITROGEN: 19 mg/dL (ref 6–20)
BUN / CREAT RATIO: 15 (ref 9–20)
CALCIUM: 9 mg/dL (ref 8.7–10.2)
CHLORIDE: 112 mmol/L — ABNORMAL HIGH (ref 96–106)
CO2: 16 mmol/L — ABNORMAL LOW (ref 20–29)
CREATININE: 1.3 mg/dL — ABNORMAL HIGH (ref 0.76–1.27)
GFR MDRD AF AMER: 86 mL/min/{1.73_m2}
GFR MDRD NON AF AMER: 75 mL/min/{1.73_m2}
GLUCOSE: 90 mg/dL (ref 65–99)
PHOSPHORUS, SERUM: 6 mg/dL — ABNORMAL HIGH (ref 2.8–4.1)
POTASSIUM: 4.3 mmol/L (ref 3.5–5.2)
SODIUM: 141 mmol/L (ref 134–144)

## 2020-11-06 LAB — MAGNESIUM: MAGNESIUM: 1.9 mg/dL (ref 1.6–2.3)

## 2020-11-07 LAB — TACROLIMUS LEVEL: TACROLIMUS BLOOD: 10.3 ng/mL (ref 2.0–20.0)

## 2020-11-08 DIAGNOSIS — Z940 Kidney transplant status: Principal | ICD-10-CM

## 2020-11-08 DIAGNOSIS — D849 Immunodeficiency, unspecified: Principal | ICD-10-CM

## 2020-11-08 LAB — CBC W/ DIFFERENTIAL
BANDED NEUTROPHILS ABSOLUTE COUNT: 0 10*3/uL (ref 0.0–0.1)
BASOPHILS ABSOLUTE COUNT: 0 10*3/uL (ref 0.0–0.2)
BASOPHILS RELATIVE PERCENT: 1 %
EOSINOPHILS ABSOLUTE COUNT: 0 10*3/uL (ref 0.0–0.4)
EOSINOPHILS RELATIVE PERCENT: 1 %
HEMATOCRIT: 32 % — ABNORMAL LOW (ref 37.5–51.0)
HEMOGLOBIN: 10.6 g/dL — ABNORMAL LOW (ref 13.0–17.7)
IMMATURE GRANULOCYTES: 0 %
LYMPHOCYTES ABSOLUTE COUNT: 0.1 10*3/uL — ABNORMAL LOW (ref 0.7–3.1)
LYMPHOCYTES RELATIVE PERCENT: 2 %
MEAN CORPUSCULAR HEMOGLOBIN CONC: 33.1 g/dL (ref 31.5–35.7)
MEAN CORPUSCULAR HEMOGLOBIN: 31.2 pg (ref 26.6–33.0)
MEAN CORPUSCULAR VOLUME: 94 fL (ref 79–97)
MONOCYTES ABSOLUTE COUNT: 0.2 10*3/uL (ref 0.1–0.9)
MONOCYTES RELATIVE PERCENT: 6 %
NEUTROPHILS ABSOLUTE COUNT: 2.7 10*3/uL (ref 1.4–7.0)
NEUTROPHILS RELATIVE PERCENT: 90 %
PLATELET COUNT: 163 10*3/uL (ref 150–450)
RED BLOOD CELL COUNT: 3.4 x10E6/uL — ABNORMAL LOW (ref 4.14–5.80)
RED CELL DISTRIBUTION WIDTH: 14.5 % (ref 11.6–15.4)
WHITE BLOOD CELL COUNT: 3 10*3/uL — ABNORMAL LOW (ref 3.4–10.8)

## 2020-11-08 LAB — RENAL FUNCTION PANEL
ALBUMIN: 4.4 g/dL (ref 4.1–5.2)
BLOOD UREA NITROGEN: 28 mg/dL — ABNORMAL HIGH (ref 6–20)
BUN / CREAT RATIO: 19 (ref 9–20)
CALCIUM: 9.2 mg/dL (ref 8.7–10.2)
CHLORIDE: 107 mmol/L — ABNORMAL HIGH (ref 96–106)
CO2: 18 mmol/L — ABNORMAL LOW (ref 20–29)
CREATININE: 1.45 mg/dL — ABNORMAL HIGH (ref 0.76–1.27)
GFR MDRD AF AMER: 76 mL/min/{1.73_m2}
GFR MDRD NON AF AMER: 66 mL/min/{1.73_m2}
GLUCOSE: 95 mg/dL (ref 65–99)
PHOSPHORUS, SERUM: 5.4 mg/dL — ABNORMAL HIGH (ref 2.8–4.1)
POTASSIUM: 4.4 mmol/L (ref 3.5–5.2)
SODIUM: 141 mmol/L (ref 134–144)

## 2020-11-08 LAB — MAGNESIUM: MAGNESIUM: 1.8 mg/dL (ref 1.6–2.3)

## 2020-11-09 ENCOUNTER — Ambulatory Visit: Admit: 2020-11-09 | Discharge: 2020-11-09 | Payer: PRIVATE HEALTH INSURANCE

## 2020-11-09 ENCOUNTER — Encounter
Admit: 2020-11-09 | Discharge: 2020-11-09 | Payer: PRIVATE HEALTH INSURANCE | Attending: Nephrology | Primary: Nephrology

## 2020-11-09 ENCOUNTER — Non-Acute Institutional Stay: Admit: 2020-11-09 | Discharge: 2020-11-09 | Payer: PRIVATE HEALTH INSURANCE

## 2020-11-09 ENCOUNTER — Encounter: Admit: 2020-11-09 | Discharge: 2020-11-09 | Payer: PRIVATE HEALTH INSURANCE

## 2020-11-09 DIAGNOSIS — D631 Anemia in chronic kidney disease: Principal | ICD-10-CM

## 2020-11-09 DIAGNOSIS — Z79890 Hormone replacement therapy (postmenopausal): Principal | ICD-10-CM

## 2020-11-09 DIAGNOSIS — Z7982 Long term (current) use of aspirin: Principal | ICD-10-CM

## 2020-11-09 DIAGNOSIS — Z23 Encounter for immunization: Principal | ICD-10-CM

## 2020-11-09 DIAGNOSIS — N189 Chronic kidney disease, unspecified: Principal | ICD-10-CM

## 2020-11-09 DIAGNOSIS — D72819 Decreased white blood cell count, unspecified: Principal | ICD-10-CM

## 2020-11-09 DIAGNOSIS — J9811 Atelectasis: Principal | ICD-10-CM

## 2020-11-09 DIAGNOSIS — R809 Proteinuria, unspecified: Principal | ICD-10-CM

## 2020-11-09 DIAGNOSIS — D849 Immunodeficiency, unspecified: Principal | ICD-10-CM

## 2020-11-09 DIAGNOSIS — Z79899 Other long term (current) drug therapy: Principal | ICD-10-CM

## 2020-11-09 DIAGNOSIS — R0902 Hypoxemia: Principal | ICD-10-CM

## 2020-11-09 DIAGNOSIS — Z0184 Encounter for antibody response examination: Principal | ICD-10-CM

## 2020-11-09 DIAGNOSIS — D84821 Immunocompromised state due to drug therapy (CMS-HCC): Principal | ICD-10-CM

## 2020-11-09 DIAGNOSIS — E7204 Cystinosis: Principal | ICD-10-CM

## 2020-11-09 DIAGNOSIS — Z4822 Encounter for aftercare following kidney transplant: Principal | ICD-10-CM

## 2020-11-09 DIAGNOSIS — Z940 Kidney transplant status: Principal | ICD-10-CM

## 2020-11-09 DIAGNOSIS — G935 Compression of brain: Principal | ICD-10-CM

## 2020-11-09 DIAGNOSIS — G43909 Migraine, unspecified, not intractable, without status migrainosus: Principal | ICD-10-CM

## 2020-11-09 DIAGNOSIS — Z1159 Encounter for screening for other viral diseases: Principal | ICD-10-CM

## 2020-11-09 DIAGNOSIS — Z48298 Encounter for aftercare following other organ transplant: Principal | ICD-10-CM

## 2020-11-09 DIAGNOSIS — E038 Other specified hypothyroidism: Principal | ICD-10-CM

## 2020-11-09 LAB — URINALYSIS
BACTERIA: NONE SEEN /HPF
BILIRUBIN UA: NEGATIVE
GLUCOSE UA: 100 — AB
KETONES UA: NEGATIVE
LEUKOCYTE ESTERASE UA: NEGATIVE
NITRITE UA: NEGATIVE
PH UA: 7.5 (ref 5.0–9.0)
PROTEIN UA: 30 — AB
RBC UA: 10 /HPF — ABNORMAL HIGH (ref <3)
SPECIFIC GRAVITY UA: 1.02 (ref 1.005–1.040)
SQUAMOUS EPITHELIAL: 1 /HPF (ref 0–5)
UROBILINOGEN UA: 0.2
WBC UA: 1 /HPF (ref <2)

## 2020-11-09 LAB — CBC W/ AUTO DIFF
BASOPHILS ABSOLUTE COUNT: 0 10*9/L (ref 0.0–0.1)
BASOPHILS RELATIVE PERCENT: 1.8 %
EOSINOPHILS ABSOLUTE COUNT: 0 10*9/L (ref 0.0–0.7)
EOSINOPHILS RELATIVE PERCENT: 1.3 %
HEMATOCRIT: 30.4 % — ABNORMAL LOW (ref 38.0–50.0)
HEMOGLOBIN: 10 g/dL — ABNORMAL LOW (ref 13.5–17.5)
LYMPHOCYTES ABSOLUTE COUNT: 0.1 10*9/L — ABNORMAL LOW (ref 0.7–4.0)
LYMPHOCYTES RELATIVE PERCENT: 1.9 %
MEAN CORPUSCULAR HEMOGLOBIN CONC: 33 g/dL (ref 30.0–36.0)
MEAN CORPUSCULAR HEMOGLOBIN: 31 pg (ref 26.0–34.0)
MEAN CORPUSCULAR VOLUME: 94 fL (ref 81.0–95.0)
MEAN PLATELET VOLUME: 7.7 fL (ref 7.0–10.0)
MONOCYTES ABSOLUTE COUNT: 0.2 10*9/L (ref 0.1–1.0)
MONOCYTES RELATIVE PERCENT: 6 %
NEUTROPHILS ABSOLUTE COUNT: 2.4 10*9/L (ref 1.7–7.7)
NEUTROPHILS RELATIVE PERCENT: 89 %
NUCLEATED RED BLOOD CELLS: 0 /100{WBCs} (ref <=4)
PLATELET COUNT: 156 10*9/L (ref 150–450)
RED BLOOD CELL COUNT: 3.23 10*12/L — ABNORMAL LOW (ref 4.32–5.72)
RED CELL DISTRIBUTION WIDTH: 15.3 % — ABNORMAL HIGH (ref 12.0–15.0)
WBC ADJUSTED: 2.7 10*9/L — ABNORMAL LOW (ref 3.5–10.5)

## 2020-11-09 LAB — COMPREHENSIVE METABOLIC PANEL
ALBUMIN: 4 g/dL (ref 3.4–5.0)
ALKALINE PHOSPHATASE: 42 U/L — ABNORMAL LOW (ref 46–116)
ALT (SGPT): <7 U/L — ABNORMAL LOW (ref 10–49)
ANION GAP: 7 mmol/L (ref 5–14)
AST (SGOT): 13 U/L (ref <=34)
BILIRUBIN TOTAL: 0.5 mg/dL (ref 0.3–1.2)
BLOOD UREA NITROGEN: 26 mg/dL — ABNORMAL HIGH (ref 9–23)
BUN / CREAT RATIO: 19
CALCIUM: 9.3 mg/dL (ref 8.7–10.4)
CHLORIDE: 112 mmol/L — ABNORMAL HIGH (ref 98–107)
CO2: 24.2 mmol/L (ref 20.0–31.0)
CREATININE: 1.36 mg/dL — ABNORMAL HIGH
EGFR CKD-EPI AA MALE: 82 mL/min/{1.73_m2} (ref >=60)
EGFR CKD-EPI NON-AA MALE: 71 mL/min/{1.73_m2} (ref >=60)
GLUCOSE RANDOM: 95 mg/dL (ref 70–179)
POTASSIUM: 4.2 mmol/L (ref 3.4–4.5)
PROTEIN TOTAL: 6.5 g/dL (ref 5.7–8.2)
SODIUM: 143 mmol/L (ref 135–145)

## 2020-11-09 LAB — PROTEIN / CREATININE RATIO, URINE
CREATININE, URINE: 48 mg/dL
PROTEIN URINE: 27.6 mg/dL
PROTEIN/CREAT RATIO, URINE: 0.575

## 2020-11-09 LAB — MAGNESIUM: MAGNESIUM: 1.8 mg/dL (ref 1.6–2.6)

## 2020-11-09 LAB — PHOSPHORUS: PHOSPHORUS: 5.1 mg/dL (ref 2.4–5.1)

## 2020-11-09 LAB — COVID-19 IGG SPIKE PROTEIN: COVID-19 IGG: POSITIVE — AB

## 2020-11-09 LAB — TSH: THYROID STIMULATING HORMONE: 2.483 u[IU]/mL (ref 0.550–4.780)

## 2020-11-09 LAB — TACROLIMUS LEVEL, TROUGH: TACROLIMUS, TROUGH: 9.1 ng/mL (ref 5.0–15.0)

## 2020-11-09 MED ADMIN — tixagevimab-cilgavimab 150 mg/1.5 mL- 150 mg/1.5 mL injection 3 mL: 3 mL | INTRAMUSCULAR | @ 17:00:00 | Stop: 2020-11-09

## 2020-11-09 NOTE — Unmapped (Signed)
Patient in clinic today, given patient information for Evushield and patient would like to received today. First injection given right gluteal , second injection given in left gluteal. Patient tolerated well. Patient also given flu vaccine in left arm. Patient will be monitored for 1 hour after injection, and states understanding of same

## 2020-11-09 NOTE — Unmapped (Signed)
Assessment    Met w/ patient and wife in [facility] today. Reviewed meds/symptoms. Any new medications? No. Requests change in tricitrates dose/frequency or switch to NaBicarb                Pt reports no fever/cold/flu symptoms    BP: 144/72 today/ Home BP reported 110-120's/60-70s   BG: wnl    Headache/Dizziness/Lightheaded: wnl   Hand tremors: denies   Numbness/tingling: denies   Fevers/chills/sweats: denies   CP/SOB/palpatations: denies   Nausea/vomiting/heartburn: denies   Diarrhea/constipation: denies   UTI symptoms (burn/pain/itch/frequency/urgency/odor/color/foam): denies   No visible or palpable edema    Appetite good; reports adequate hydration. 2L/fluid per day.    Pt reports being well rested and getting adequate exercise despite Covid 19 quarantine. Taking care to mask, hand hygeine and minimal public activity. Offered support and guidance for this process given his immune suppressed state. Also discussed reduced covid vaccine coverage for transplant patients and importance of continuing to mask and practice safe distancing. Commented that a booster vaccine may be advised in the near future. *He will get Evusheld injections today in [facility] clinic      Pain: denies   Incision: c/d/i staples removed in clinic   Drain: n/a   Foley: n/a    Last tac taken 0900; held for this morning's labs.    No other complaints or concerns.     Referrals needed: Neuro r/t MRI findings    Immunization status: flu and Evusheld given today      Functional Score: 100    Employment/work status: works full time

## 2020-11-09 NOTE — Unmapped (Signed)
Transplant Nephrology Clinic Visit    Assessment and Plan  Charles Huff is a 30 y.o. male with a history of infantile cystinosis who is seen in follow up of his second kidney transplant on 10/01/20 and to address associated medical concerns. 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   - UPC 0.575 today is consistent with recent values. Proteinuria may be partially from native and/or first transplant kidneys.  - cPRA pre-transplant was 0%, DSA screen from today is pending      Immunosuppression management.   - Tacrolimus trough 9.1 (target 6-10).    - Continue tacrolimus 4 mg bid   - Continue CellCept 750 mg bid. Prior to second transplant he was on Myfortic 180 mg bid with dose limited by leukopenia. WBC is now 2.7 with ANC 2.4.   - Continue prednisone 5 mg daily.      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  - Continue atovaquone due to history of sulfa allergy and dapsone associated hemolysis   - Evusheld administered today    Immunization status  - COVID-19 vaccine x 3 (05/25/20, 06/15/20, 10/18/20), had COVID-19 infection November 2020.   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 Next lab draw to be done just prior to dose rather than the day following dosing.    Cystinosis.   - Continue Cystagon 450 mg QID.   - He plans to start taking eye gtts. Optho consult confirmed diffuse stromal crystals bilaterally   - Will check WBC cystine periodically (most recent 0.36 on 11/09/19). Today's lab was received too late in the lab.  - Patient wishes to be seen at [facility] in the next few months.    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  - Continue Synthroid 50 mcg.   - TSH 2.483  - He is clinically euthyroid      Anemia, improved.   - Course complicated by probable dapsone induced hmolysis   - Hemoglobin 10.0 today   - Aranesp 200 mcg last administered on 10/10/20     Leukopenia.    - WBC is 2.7 with ANC 2.4.   - Leukopenia limited the mycophenolate dosing after first transplant. Will monitor WBC on higher dose anti-metabolite therapy (CellCept 750 mg bid versus Myfortic 180 bid)     Post-Operative Hemoptysis, Right basilar consolidation  - Hemoptysis has not recurred.  - PE was felt to be unlikely  -  Findings in the hospital were felt consistent with post-operative atelectasis  - Will have low threshold for repeat CXR with any persistent symptoms.      History of COVID-19 infection, 08/2019.   - Initially had palpitations that resolved.  - No long-term sequelae.   - S/P COVID vaccine x 3 after infection.   - Evusheld administered today  - He is aware of the limited efficacy of the COVID-19 vaccine    Follow up.   - My clinic 12/07/20  - Labs 2 days per week.     History of Present Illness:     Charles Huff is a 30 y.o. 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 He was hospitalized 10/11/20-10/13/20 after presenting with hemoglobin 6.2, transient hypoxemia, and severe headache. Methemoblobin level was 10.0 and haptoglobin <1. His  anemia was felt to be due to a combination of CKD and dapsone. He was transfused 2 units pRBCs and switched to atovaquone prophylaxis from Dapsone. Brain MRI revealed a Chiari-1 malformation with slight distention of the optic nerve sheath. Neurology was consulted and they felt his headache was due to a migraine and that the Chiari-1 malformation was not contributory to his headaches. They did not suggest neurology follow up was necessary. Ophthalmology consult revealed no papilledema to suggest pseudotumor cerebri.    He presents today stating he is doing well. He has mild tremors, but no headaches. His surgical incision is healing with staples still in place. He denies dysuria, gross hematuria, or fever. He denies cough, hemoptysis, dyspnea on exertion, chest pain or edema. He denies nausea or vomiting. He still has mild intermittent diarrhea.      His last dose of tacrolimus was at 9 PM.    Transplant History:  - Native kidney disease - cystinosis  - Transplant #1, 08/05/16 living donor transplant:  ?? Father living donor  ?? Delayed graft function   ?? Creatinine never below 3.0 mg/dL. Creatinine baseline prior to second transplant 3.7-4.5 mg/dL.    ?? Induction - Campath and Solumedrol\  ?? Maintenance therapy tacrolimus and Myfortic.  ?? Serologies CMV D+/R-, EBV D+/R- prior to first transplant. EBV never seroconverted.    ?? Kidney biopsies on 08/14/16, 09/04/2016, and 10/10/2016 revealed ATN, atypical epithelial cells with intracellular vacuolization, electron microscopic findings of unusual mitochondria, and findings of early calcineurin inhibitor toxicity.   ?? Kidney biopsy 08/10/2017 revealed diffuse IF/TA without other abnormalities.   ?? No rejection or DSAs after first transplant   ?? Complication: CMV infection  ?? Urothelial thickening with an indeterminate 7 mm nodular focus concerning for possible urothelial lesion. This was almost completely resolved on follow-up US 12/02/2017 and not noted on 11/17/18 or 11/09/19.    - Transplant # 2, deceased donor transplant 10-05-20:  ?? Pre-transplant cPRA 0%  ?? KDPI 13%, cold ischemia time  ?? CMV D-R+, EBV D+R-  ?? Implantation biopsy Oct 05, 2020 - no pathology noted  ?? Induction agent - Campath  ?? Maintenance IS on discharge - tacrolimus, CellCept, prednisone  ?? No delayed graft function  ?? Complicated by atelectasis with cough, hemoptysis  ?? Readmitted for dapsone associated anemia on 10/13/20    Review of Systems    All other systems are reviewed and are negative. A 10 systems review was completed.    Medications    Current Outpatient Medications   Medication Sig Dispense  Refill   ??? acetaminophen (TYLENOL) 500 MG tablet Take 1-2 tablets (500-1,000 mg total) by mouth every six (6) hours as needed for pain or fever. 50 tablet 0   ??? aspirin (ECOTRIN) 81 MG tablet Take 1 tablet (81 mg total) by mouth daily. 90 tablet 3   ??? atovaquone (MEPRON) 750 mg/5 mL suspension Take 10 mL (1,500 mg total) by mouth daily. 300 mL 5   ??? cholecalciferol, vitamin D3-50 mcg, 2,000 unit,, 50 mcg (2,000 unit) tablet Take 2 tablets (100 mcg total) by mouth daily. 60 tablet 11   ??? cysteamine bitartrate (CYSTAGON) 150 mg cap TAKE 3 CAPSULES BY MOUTH 4 TIMES A DAY 360 capsule 8   ??? levothyroxine (SYNTHROID) 50 MCG tablet TAKE ONE TABLET AT BEDTIME 90 each 5   ??? loratadine (CLARITIN) 10 mg tablet Take 1 tablet (10 mg total) by mouth daily as needed for allergies. 30 tablet 0   ??? magnesium oxide-Mg AA chelate (MAGNESIUM, AMINO ACID CHELATE,) 133 mg Tab Take 1 tablet by mouth two (2) times a day. 100 tablet 5   ??? mycophenolate (CELLCEPT) 250 mg capsule Take 3 capsules (750 mg total) by mouth Two (2) times a day. 180 capsule 11   ??? potassium & sodium citrate-citric acid (TRICITRATES) 550-500-334 mg/5 mL Soln Take 60mL by mouth 3 times a day (after meals) for 1 week then take 60mL by mouth twice daily. 473 mL 5   ??? tacrolimus (PROGRAF) 1 MG capsule Take 4 capsules (4 mg total) by mouth two (2) times a day. 240 capsule 11   ??? valGANciclovir (VALCYTE) 450 mg tablet Take 2 tablets (900 mg total) by mouth daily. 60 tablet 11     No current facility-administered medications for this visit.       Physical Exam    BP 144/72 (BP Site: L Arm, BP Position: Sitting, BP Cuff Size: Medium)  - Pulse 74  - Resp 16  - Ht 172.7 cm (5' 8)  - Wt 64 kg (141 lb 3.2 oz)  - BMI 21.47 kg/m??   General: Patient is a pleasant male in no apparent distress.  Eyes: Sclera anicteric.  Neck: Supple without LAD/JVD/bruits.  Lungs: Clear to auscultation bilaterally, no wheezes/rales/rhonchi.  Cardiovascular: Regular rate and rhythm without murmurs, rubs or gallops.  Abdomen: Incision site without drainage or erythema, staples in place  Extremities: Without edema, joints without evidence of synovitis  Skin: Without rash  Neurological: Grossly nonfocal.  Psychiatric: Mood and affect appropriate.    Laboratory Results and Imaging Reviewed in Epic

## 2020-11-09 NOTE — Unmapped (Signed)
BRIEF TRANSPLANT PHARMACY CLINIC NOTE  11/09/20    Medication Changes:  1. Give Evusheld in clinic  2. Give Flu shot in clinic  3. Decrease Tricitrates to twice weekly     -Saw pt and his wife at end of visit with Dr. Margaretmary Bayley.  Reports no issues with medications and that they plan on using [facility] for refills (except for [facility] for cysteamine)   -Encouraged pt to let [facility] know if he become COVID+ as there are now PO treatments available in addition to monoclonal antibodies.    Jordan Likes, PharmD, CPP  Clinical Pharmacist Pracititioner

## 2020-11-10 LAB — TACROLIMUS LEVEL: TACROLIMUS BLOOD: 10.1 ng/mL (ref 2.0–20.0)

## 2020-11-10 LAB — CMV QUANTITATIVE PCR, BLOOD: CMV QUANT (MAYO): 143 [IU]/mL — AB

## 2020-11-11 LAB — EBV QUANTITATIVE PCR, BLOOD: EBV VIRAL LOAD RESULT: NOT DETECTED

## 2020-11-12 ENCOUNTER — Other Ambulatory Visit (HOSPITAL_COMMUNITY): Payer: Self-pay

## 2020-11-12 DIAGNOSIS — Z940 Kidney transplant status: Principal | ICD-10-CM

## 2020-11-12 DIAGNOSIS — D849 Immunodeficiency, unspecified: Principal | ICD-10-CM

## 2020-11-12 MED ORDER — POTAS AND SOD CITRATE-CITRIC ACID 550 MG-500 MG-334 MG/5 ML ORAL SOLN
ORAL | 5 refills | 25 days | Status: CP
Start: 2020-11-12 — End: ?

## 2020-11-12 MED ORDER — LEVOTHYROXINE 50 MCG TABLET
ORAL_TABLET | Freq: Every evening | ORAL | 11 refills | 30 days | Status: CP
Start: 2020-11-12 — End: 2021-11-12

## 2020-11-13 DIAGNOSIS — D849 Immunodeficiency, unspecified: Principal | ICD-10-CM

## 2020-11-13 DIAGNOSIS — Z940 Kidney transplant status: Principal | ICD-10-CM

## 2020-11-15 ENCOUNTER — Encounter: Admit: 2020-11-15 | Discharge: 2020-11-15 | Payer: PRIVATE HEALTH INSURANCE | Attending: Urology | Primary: Urology

## 2020-11-15 DIAGNOSIS — Z466 Encounter for fitting and adjustment of urinary device: Principal | ICD-10-CM

## 2020-11-15 DIAGNOSIS — D849 Immunodeficiency, unspecified: Principal | ICD-10-CM

## 2020-11-15 DIAGNOSIS — Z940 Kidney transplant status: Principal | ICD-10-CM

## 2020-11-15 LAB — CBC W/ DIFFERENTIAL
BANDED NEUTROPHILS ABSOLUTE COUNT: 0 10*3/uL (ref 0.0–0.1)
BASOPHILS ABSOLUTE COUNT: 0 10*3/uL (ref 0.0–0.2)
BASOPHILS RELATIVE PERCENT: 1 %
EOSINOPHILS ABSOLUTE COUNT: 0.1 10*3/uL (ref 0.0–0.4)
EOSINOPHILS RELATIVE PERCENT: 2 %
HEMATOCRIT: 30.8 % — ABNORMAL LOW (ref 37.5–51.0)
HEMOGLOBIN: 10.1 g/dL — ABNORMAL LOW (ref 13.0–17.7)
IMMATURE GRANULOCYTES: 0 %
LYMPHOCYTES ABSOLUTE COUNT: 0.1 10*3/uL — ABNORMAL LOW (ref 0.7–3.1)
LYMPHOCYTES RELATIVE PERCENT: 3 %
MEAN CORPUSCULAR HEMOGLOBIN CONC: 32.8 g/dL (ref 31.5–35.7)
MEAN CORPUSCULAR HEMOGLOBIN: 30.7 pg (ref 26.6–33.0)
MEAN CORPUSCULAR VOLUME: 94 fL (ref 79–97)
MONOCYTES ABSOLUTE COUNT: 0.2 10*3/uL (ref 0.1–0.9)
MONOCYTES RELATIVE PERCENT: 6 %
NEUTROPHILS ABSOLUTE COUNT: 2.5 10*3/uL (ref 1.4–7.0)
NEUTROPHILS RELATIVE PERCENT: 88 %
PLATELET COUNT: 174 10*3/uL (ref 150–450)
RED BLOOD CELL COUNT: 3.29 x10E6/uL — ABNORMAL LOW (ref 4.14–5.80)
RED CELL DISTRIBUTION WIDTH: 13.8 % (ref 11.6–15.4)
WHITE BLOOD CELL COUNT: 2.8 10*3/uL — ABNORMAL LOW (ref 3.4–10.8)

## 2020-11-15 LAB — RENAL FUNCTION PANEL
ALBUMIN: 4.5 g/dL (ref 4.1–5.2)
BLOOD UREA NITROGEN: 23 mg/dL — ABNORMAL HIGH (ref 6–20)
BUN / CREAT RATIO: 16 (ref 9–20)
CALCIUM: 8.8 mg/dL (ref 8.7–10.2)
CHLORIDE: 110 mmol/L — ABNORMAL HIGH (ref 96–106)
CO2: 16 mmol/L — ABNORMAL LOW (ref 20–29)
CREATININE: 1.46 mg/dL — ABNORMAL HIGH (ref 0.76–1.27)
GFR MDRD AF AMER: 75 mL/min/{1.73_m2}
GFR MDRD NON AF AMER: 65 mL/min/{1.73_m2}
GLUCOSE: 93 mg/dL (ref 65–99)
PHOSPHORUS, SERUM: 5.6 mg/dL — ABNORMAL HIGH (ref 2.8–4.1)
POTASSIUM: 4.1 mmol/L (ref 3.5–5.2)
SODIUM: 140 mmol/L (ref 134–144)

## 2020-11-15 LAB — MAGNESIUM: MAGNESIUM: 2 mg/dL (ref 1.6–2.3)

## 2020-11-15 MED ADMIN — cephalexin (KEFLEX) capsule 500 mg: 500 mg | ORAL | @ 20:00:00 | Stop: 2020-11-15

## 2020-11-15 MED ADMIN — sodium chloride irrigation (NS) 0.9 % irrigation solution 500 mL: 500 mL | @ 20:00:00 | Stop: 2020-11-15

## 2020-11-15 MED ADMIN — lidocaine 2% gel (XYLOCAINE) jelly urojet 20 mL: 20 mL | URETHRAL | @ 20:00:00 | Stop: 2020-11-15

## 2020-11-15 NOTE — Unmapped (Signed)
[facility]:  Taking Care of Yourself After Cystoscopy Procedures    *Drink plenty of water for a day or two following your procedure.  Try to have about 8 ounces (one cup) at a time, and do this 6 times or more per day.  (If you have fluid restrictions, please ask the nurse or doctor for advice).    *AVOID alcoholic, carbonated and caffeinated drinks for a day or two, as they may cause uncomfortable symptoms.    *For the first 8 hours after the procedure, your urine may be pink or red in color.  Small clots or a few drops of blood can be a normal side effect of the instruments.  Large amounts of bleeding or difficulty urinating are not normal.  Call your doctor if this happens.    *You may experience some mild discomfort of a burning sensation with urination after having this procedure.  If it does not improve, or if other symptoms appear (fever, chills, or difficulty emptying), call your doctor.    *You may return to normal daily activities such as work, school, driving, exercising and housework.    *If your doctor gave you a prescription, take it as ordered.    *If you need a return appointment, the secretary will make it for you when you check out. To contact the Urology Clinic during business hours, call [phone].    *[facility] Operator can be reached at [phone] if you need to get in contact with your doctor.  After the hours, the operator can page the doctor on call for urgent concerns:    Urology Patients should ask for the Urology resident “on call”.    You can get more immediate assistance at the Emergency Room or Urgent Care if necessary.

## 2020-11-15 NOTE — Unmapped (Signed)
[facility] Refill Coordination Note    Specialty Medication(s) to be Shipped:   Transplant: None- patient declined valganciclovir, tacrolimus, mycophenolate and atovaquone today.    Other medication(s) to be shipped: No additional medications requested for fill at this time     Charles Huff, DOB: [birth year]  Phone: [phone] (home) [phone] (work)      All above HIPAA information was verified with patient.     Was a Nurse, learning disability used for this call? No    Completed refill call assessment today to schedule patient's medication shipment from the [facility] [phone]).       Specialty medication(s) and dose(s) confirmed: Regimen is correct and unchanged.   Changes to medications: [name] reports no changes at this time.  Changes to insurance: No  Questions for the pharmacist: No    Confirmed patient received Welcome Packet with first shipment. The patient will receive a drug information handout for each medication shipped and additional FDA Medication Guides as required.       DISEASE/MEDICATION-SPECIFIC INFORMATION        N/A    SPECIALTY MEDICATION ADHERENCE     Medication Adherence    Patient reported X missed doses in the last month: 0  Specialty Medication: Atovaquone 750mg /67ml  Patient is on additional specialty medications: Yes  Additional Specialty Medications: Valganciclovir 450mg   Patient Reported Additional Medication X Missed Doses in the Last Month: 0  Patient is on more than two specialty medications: Yes  Specialty Medication: Tacrolimus 1mg   Patient Reported Additional Medication X Missed Doses in the Last Month: 0  Specialty Medication: Mycophenolate 250mg   Patient Reported Additional Medication X Missed Doses in the Last Month: 0                Atovaquone 750 mg/44ml: 20 days of medicine on hand   Tacrolimus 1 mg: 14 days of medicine on hand   Mycophenolate 250 mg: 14 days of medicine on hand   Valganciclovir 450 mg: 14 days of medicine on hand SHIPPING     Shipping address confirmed in Epic.     Delivery Scheduled: Patient declined refill at this time due to has at least 2 weeks of medicine on hand..     Medication will be delivered via UPS to the prescription address in Epic WAM.    Tera Helper   [facility] Specialty Pharmacist

## 2020-11-15 NOTE — Unmapped (Signed)
Indications: indwelling ureteral stent following renal transplantation    Cystoscopy, ureteral stent removal  Timeout was performed immediately prior to the procedure.    The patient was prepped and draped in the usual sterile fashion.  Flexible cystoscopy was performed.  The indwelling transplant ureteral stent was visualized, grasped, and removed intact.  The patient tolerated the procedure well     Antibiotics: keflex 500mg     Plan: Notify us or transplant coordinator with any change in GU symptoms / urine output

## 2020-11-17 LAB — CBC W/ DIFFERENTIAL
BANDED NEUTROPHILS ABSOLUTE COUNT: 0 10*3/uL (ref 0.0–0.1)
BASOPHILS ABSOLUTE COUNT: 0 10*3/uL (ref 0.0–0.2)
BASOPHILS RELATIVE PERCENT: 1 %
EOSINOPHILS ABSOLUTE COUNT: 0.1 10*3/uL (ref 0.0–0.4)
EOSINOPHILS RELATIVE PERCENT: 1 %
HEMATOCRIT: 31.4 % — ABNORMAL LOW (ref 37.5–51.0)
HEMOGLOBIN: 10.2 g/dL — ABNORMAL LOW (ref 13.0–17.7)
IMMATURE GRANULOCYTES: 0 %
LYMPHOCYTES ABSOLUTE COUNT: 0.1 10*3/uL — ABNORMAL LOW (ref 0.7–3.1)
LYMPHOCYTES RELATIVE PERCENT: 1 %
MEAN CORPUSCULAR HEMOGLOBIN CONC: 32.5 g/dL (ref 31.5–35.7)
MEAN CORPUSCULAR HEMOGLOBIN: 30.4 pg (ref 26.6–33.0)
MEAN CORPUSCULAR VOLUME: 94 fL (ref 79–97)
MONOCYTES ABSOLUTE COUNT: 0.3 10*3/uL (ref 0.1–0.9)
MONOCYTES RELATIVE PERCENT: 7 %
NEUTROPHILS ABSOLUTE COUNT: 3.8 10*3/uL (ref 1.4–7.0)
NEUTROPHILS RELATIVE PERCENT: 90 %
PLATELET COUNT: 200 10*3/uL (ref 150–450)
RED BLOOD CELL COUNT: 3.36 x10E6/uL — ABNORMAL LOW (ref 4.14–5.80)
RED CELL DISTRIBUTION WIDTH: 14 % (ref 11.6–15.4)
WHITE BLOOD CELL COUNT: 4.2 10*3/uL (ref 3.4–10.8)

## 2020-11-17 LAB — RENAL FUNCTION PANEL
ALBUMIN: 4.4 g/dL (ref 4.1–5.2)
BLOOD UREA NITROGEN: 22 mg/dL — ABNORMAL HIGH (ref 6–20)
BUN / CREAT RATIO: 11 (ref 9–20)
CALCIUM: 9.1 mg/dL (ref 8.7–10.2)
CHLORIDE: 110 mmol/L — ABNORMAL HIGH (ref 96–106)
CO2: 15 mmol/L — ABNORMAL LOW (ref 20–29)
CREATININE: 1.97 mg/dL — ABNORMAL HIGH (ref 0.76–1.27)
GFR MDRD AF AMER: 52 mL/min/{1.73_m2} — ABNORMAL LOW
GFR MDRD NON AF AMER: 45 mL/min/{1.73_m2} — ABNORMAL LOW
GLUCOSE: 95 mg/dL (ref 65–99)
PHOSPHORUS, SERUM: 5.5 mg/dL — ABNORMAL HIGH (ref 2.8–4.1)
POTASSIUM: 4.2 mmol/L (ref 3.5–5.2)
SODIUM: 141 mmol/L (ref 134–144)

## 2020-11-17 LAB — MAGNESIUM: MAGNESIUM: 2 mg/dL (ref 1.6–2.3)

## 2020-11-17 LAB — TACROLIMUS LEVEL: TACROLIMUS BLOOD: 9.4 ng/mL (ref 2.0–20.0)

## 2020-11-19 DIAGNOSIS — D849 Immunodeficiency, unspecified: Principal | ICD-10-CM

## 2020-11-19 DIAGNOSIS — Z940 Kidney transplant status: Principal | ICD-10-CM

## 2020-11-19 LAB — HLA DS POST TRANSPLANT
ANTI-DONOR DRW #1 MFI: 495 MFI
ANTI-DONOR DRW #2 MFI: 0 MFI
ANTI-DONOR HLA-A #1 MFI: 0 MFI
ANTI-DONOR HLA-A #2 MFI: 0 MFI
ANTI-DONOR HLA-B #1 MFI: 0 MFI
ANTI-DONOR HLA-B #2 MFI: 0 MFI
ANTI-DONOR HLA-C #1 MFI: 0 MFI
ANTI-DONOR HLA-C #2 MFI: 0 MFI
ANTI-DONOR HLA-DP #2 MFI: 0 MFI
ANTI-DONOR HLA-DQB #1 MFI: 0 MFI
ANTI-DONOR HLA-DQB #2 MFI: 49 MFI
ANTI-DONOR HLA-DR #1 MFI: 0 MFI
ANTI-DONOR HLA-DR #2 MFI: 0 MFI

## 2020-11-19 LAB — FSAB CLASS 2 ANTIBODY SPECIFICITY: HLA CL2 AB RESULT: POSITIVE

## 2020-11-19 LAB — FSAB CLASS 1 ANTIBODY SPECIFICITY: HLA CLASS 1 ANTIBODY RESULT: POSITIVE

## 2020-11-20 DIAGNOSIS — D849 Immunodeficiency, unspecified: Principal | ICD-10-CM

## 2020-11-20 DIAGNOSIS — Z940 Kidney transplant status: Principal | ICD-10-CM

## 2020-11-20 LAB — TACROLIMUS LEVEL: TACROLIMUS BLOOD: 9.2 ng/mL (ref 2.0–20.0)

## 2020-11-21 LAB — RENAL FUNCTION PANEL
ALBUMIN: 4.6 g/dL (ref 4.1–5.2)
BLOOD UREA NITROGEN: 27 mg/dL — ABNORMAL HIGH (ref 6–20)
BUN / CREAT RATIO: 16 (ref 9–20)
CALCIUM: 9.1 mg/dL (ref 8.7–10.2)
CHLORIDE: 108 mmol/L — ABNORMAL HIGH (ref 96–106)
CO2: 20 mmol/L (ref 20–29)
CREATININE: 1.73 mg/dL — ABNORMAL HIGH (ref 0.76–1.27)
GFR MDRD AF AMER: 61 mL/min/{1.73_m2}
GFR MDRD NON AF AMER: 53 mL/min/{1.73_m2} — ABNORMAL LOW
GLUCOSE: 97 mg/dL (ref 65–99)
PHOSPHORUS, SERUM: 4.7 mg/dL — ABNORMAL HIGH (ref 2.8–4.1)
POTASSIUM: 4.3 mmol/L (ref 3.5–5.2)
SODIUM: 143 mmol/L (ref 134–144)

## 2020-11-21 LAB — CBC W/ DIFFERENTIAL
BANDED NEUTROPHILS ABSOLUTE COUNT: 0 10*3/uL (ref 0.0–0.1)
BASOPHILS ABSOLUTE COUNT: 0 10*3/uL (ref 0.0–0.2)
BASOPHILS RELATIVE PERCENT: 1 %
EOSINOPHILS ABSOLUTE COUNT: 0 10*3/uL (ref 0.0–0.4)
EOSINOPHILS RELATIVE PERCENT: 1 %
HEMATOCRIT: 29.3 % — ABNORMAL LOW (ref 37.5–51.0)
HEMOGLOBIN: 10.1 g/dL — ABNORMAL LOW (ref 13.0–17.7)
IMMATURE GRANULOCYTES: 0 %
LYMPHOCYTES ABSOLUTE COUNT: 0.1 10*3/uL — ABNORMAL LOW (ref 0.7–3.1)
LYMPHOCYTES RELATIVE PERCENT: 2 %
MEAN CORPUSCULAR HEMOGLOBIN CONC: 34.5 g/dL (ref 31.5–35.7)
MEAN CORPUSCULAR HEMOGLOBIN: 31.9 pg (ref 26.6–33.0)
MEAN CORPUSCULAR VOLUME: 92 fL (ref 79–97)
MONOCYTES ABSOLUTE COUNT: 0.2 10*3/uL (ref 0.1–0.9)
MONOCYTES RELATIVE PERCENT: 8 %
NEUTROPHILS ABSOLUTE COUNT: 2 10*3/uL (ref 1.4–7.0)
NEUTROPHILS RELATIVE PERCENT: 88 %
PLATELET COUNT: 195 10*3/uL (ref 150–450)
RED BLOOD CELL COUNT: 3.17 x10E6/uL — ABNORMAL LOW (ref 4.14–5.80)
RED CELL DISTRIBUTION WIDTH: 13.3 % (ref 11.6–15.4)
WHITE BLOOD CELL COUNT: 2.3 10*3/uL — CL (ref 3.4–10.8)

## 2020-11-21 LAB — MAGNESIUM: MAGNESIUM: 2.1 mg/dL (ref 1.6–2.3)

## 2020-11-22 DIAGNOSIS — Z940 Kidney transplant status: Principal | ICD-10-CM

## 2020-11-22 DIAGNOSIS — D849 Immunodeficiency, unspecified: Principal | ICD-10-CM

## 2020-11-22 NOTE — Unmapped (Signed)
[facility] Refill Coordination Note    Specialty Medication(s) to be Shipped:   Transplant: mycophenolate mofetil 250mg , tacrolimus 1mg  and valgancyclovir 450mg     Other medication(s) to be shipped: mag 133mg      Charles Huff, DOB: October 11, 1991  Phone: [phone] (home) [phone] (work)      All above HIPAA information was verified with patient's caregiver, wife     Was a Nurse, learning disability used for this call? No    Completed refill call assessment today to schedule patient's medication shipment from the [facility] [phone]).       Specialty medication(s) and dose(s) confirmed: Regimen is correct and unchanged.   Changes to medications: Josuha reports no changes at this time.  Changes to insurance: No  Questions for the pharmacist: No    Confirmed patient received Welcome Packet with first shipment. The patient will receive a drug information handout for each medication shipped and additional FDA Medication Guides as required.       DISEASE/MEDICATION-SPECIFIC INFORMATION        N/A    SPECIALTY MEDICATION ADHERENCE     Medication Adherence    Patient reported X missed doses in the last month: 0      mycophenolate mofetil 250mg   10 days worth of medication on hand.   tacrolimus 1mg  10 days worth of medication on hand.  valgancyclovir 450mg    7 days worth of medication on hand.                SHIPPING     Shipping address confirmed in Epic.     Delivery Scheduled: Yes, Expected medication delivery date: 11/27/20.     Medication will be delivered via Next Day Courier to the prescription address in Epic WAM.    Charles Huff   [facility] Specialty Technician

## 2020-11-23 LAB — TACROLIMUS LEVEL: TACROLIMUS BLOOD: 7.6 ng/mL (ref 2.0–20.0)

## 2020-11-24 LAB — RENAL FUNCTION PANEL
ALBUMIN: 4.6 g/dL (ref 4.1–5.2)
BLOOD UREA NITROGEN: 21 mg/dL — ABNORMAL HIGH (ref 6–20)
BUN / CREAT RATIO: 13 (ref 9–20)
CALCIUM: 9.4 mg/dL (ref 8.7–10.2)
CHLORIDE: 110 mmol/L — ABNORMAL HIGH (ref 96–106)
CO2: 20 mmol/L (ref 20–29)
CREATININE: 1.66 mg/dL — ABNORMAL HIGH (ref 0.76–1.27)
GFR MDRD AF AMER: 64 mL/min/{1.73_m2}
GFR MDRD NON AF AMER: 56 mL/min/{1.73_m2} — ABNORMAL LOW
GLUCOSE: 100 mg/dL — ABNORMAL HIGH (ref 65–99)
PHOSPHORUS, SERUM: 5.1 mg/dL — ABNORMAL HIGH (ref 2.8–4.1)
POTASSIUM: 4.7 mmol/L (ref 3.5–5.2)
SODIUM: 146 mmol/L — ABNORMAL HIGH (ref 134–144)

## 2020-11-24 LAB — CBC W/ DIFFERENTIAL
BANDED NEUTROPHILS ABSOLUTE COUNT: 0 10*3/uL (ref 0.0–0.1)
BASOPHILS ABSOLUTE COUNT: 0 10*3/uL (ref 0.0–0.2)
BASOPHILS RELATIVE PERCENT: 1 %
EOSINOPHILS ABSOLUTE COUNT: 0.1 10*3/uL (ref 0.0–0.4)
EOSINOPHILS RELATIVE PERCENT: 3 %
HEMATOCRIT: 30.4 % — ABNORMAL LOW (ref 37.5–51.0)
HEMOGLOBIN: 10.1 g/dL — ABNORMAL LOW (ref 13.0–17.7)
IMMATURE GRANULOCYTES: 0 %
LYMPHOCYTES ABSOLUTE COUNT: 0.1 10*3/uL — ABNORMAL LOW (ref 0.7–3.1)
LYMPHOCYTES RELATIVE PERCENT: 3 %
MEAN CORPUSCULAR HEMOGLOBIN CONC: 33.2 g/dL (ref 31.5–35.7)
MEAN CORPUSCULAR HEMOGLOBIN: 30.5 pg (ref 26.6–33.0)
MEAN CORPUSCULAR VOLUME: 92 fL (ref 79–97)
MONOCYTES ABSOLUTE COUNT: 0.1 10*3/uL (ref 0.1–0.9)
MONOCYTES RELATIVE PERCENT: 5 %
NEUTROPHILS ABSOLUTE COUNT: 2.1 10*3/uL (ref 1.4–7.0)
NEUTROPHILS RELATIVE PERCENT: 88 %
PLATELET COUNT: 193 10*3/uL (ref 150–450)
RED BLOOD CELL COUNT: 3.31 x10E6/uL — ABNORMAL LOW (ref 4.14–5.80)
RED CELL DISTRIBUTION WIDTH: 13.3 % (ref 11.6–15.4)
WHITE BLOOD CELL COUNT: 2.4 10*3/uL — CL (ref 3.4–10.8)

## 2020-11-24 LAB — MAGNESIUM: MAGNESIUM: 2 mg/dL (ref 1.6–2.3)

## 2020-11-26 DIAGNOSIS — D849 Immunodeficiency, unspecified: Principal | ICD-10-CM

## 2020-11-26 DIAGNOSIS — Z940 Kidney transplant status: Principal | ICD-10-CM

## 2020-11-26 DIAGNOSIS — N185 Chronic kidney disease, stage 5: Principal | ICD-10-CM

## 2020-11-26 LAB — TACROLIMUS LEVEL: TACROLIMUS BLOOD: 7.5 ng/mL (ref 2.0–20.0)

## 2020-11-26 MED FILL — VALGANCICLOVIR 450 MG TABLET: ORAL | 30 days supply | Qty: 60 | Fill #0

## 2020-11-26 MED FILL — TACROLIMUS 1 MG CAPSULE, IMMEDIATE-RELEASE: ORAL | 30 days supply | Qty: 240 | Fill #1

## 2020-11-26 MED FILL — MYCOPHENOLATE MOFETIL 250 MG CAPSULE: ORAL | 30 days supply | Qty: 180 | Fill #1

## 2020-11-27 DIAGNOSIS — D849 Immunodeficiency, unspecified: Principal | ICD-10-CM

## 2020-11-27 DIAGNOSIS — Z940 Kidney transplant status: Principal | ICD-10-CM

## 2020-11-27 LAB — CBC W/ DIFFERENTIAL
BANDED NEUTROPHILS ABSOLUTE COUNT: 0 10*3/uL (ref 0.0–0.1)
BASOPHILS ABSOLUTE COUNT: 0 10*3/uL (ref 0.0–0.2)
BASOPHILS RELATIVE PERCENT: 1 %
EOSINOPHILS ABSOLUTE COUNT: 0.1 10*3/uL (ref 0.0–0.4)
EOSINOPHILS RELATIVE PERCENT: 2 %
HEMATOCRIT: 30.9 % — ABNORMAL LOW (ref 37.5–51.0)
HEMOGLOBIN: 10.5 g/dL — ABNORMAL LOW (ref 13.0–17.7)
IMMATURE GRANULOCYTES: 0 %
LYMPHOCYTES ABSOLUTE COUNT: 0.1 10*3/uL — ABNORMAL LOW (ref 0.7–3.1)
LYMPHOCYTES RELATIVE PERCENT: 3 %
MEAN CORPUSCULAR HEMOGLOBIN CONC: 34 g/dL (ref 31.5–35.7)
MEAN CORPUSCULAR HEMOGLOBIN: 31.3 pg (ref 26.6–33.0)
MEAN CORPUSCULAR VOLUME: 92 fL (ref 79–97)
MONOCYTES ABSOLUTE COUNT: 0.2 10*3/uL (ref 0.1–0.9)
MONOCYTES RELATIVE PERCENT: 8 %
NEUTROPHILS ABSOLUTE COUNT: 2.6 10*3/uL (ref 1.4–7.0)
NEUTROPHILS RELATIVE PERCENT: 86 %
PLATELET COUNT: 191 10*3/uL (ref 150–450)
RED BLOOD CELL COUNT: 3.36 x10E6/uL — ABNORMAL LOW (ref 4.14–5.80)
RED CELL DISTRIBUTION WIDTH: 13.2 % (ref 11.6–15.4)
WHITE BLOOD CELL COUNT: 3 10*3/uL — ABNORMAL LOW (ref 3.4–10.8)

## 2020-11-28 LAB — RENAL FUNCTION PANEL
ALBUMIN: 4.5 g/dL (ref 4.1–5.2)
BLOOD UREA NITROGEN: 19 mg/dL (ref 6–20)
BUN / CREAT RATIO: 13 (ref 9–20)
CALCIUM: 8.7 mg/dL (ref 8.7–10.2)
CHLORIDE: 107 mmol/L — ABNORMAL HIGH (ref 96–106)
CO2: 20 mmol/L (ref 20–29)
CREATININE: 1.52 mg/dL — ABNORMAL HIGH (ref 0.76–1.27)
GFR MDRD AF AMER: 72 mL/min/{1.73_m2}
GFR MDRD NON AF AMER: 62 mL/min/{1.73_m2}
GLUCOSE: 98 mg/dL (ref 65–99)
PHOSPHORUS, SERUM: 4.8 mg/dL — ABNORMAL HIGH (ref 2.8–4.1)
POTASSIUM: 4.3 mmol/L (ref 3.5–5.2)
SODIUM: 142 mmol/L (ref 134–144)

## 2020-11-28 LAB — MAGNESIUM: MAGNESIUM: 1.9 mg/dL (ref 1.6–2.3)

## 2020-11-29 DIAGNOSIS — D849 Immunodeficiency, unspecified: Principal | ICD-10-CM

## 2020-11-29 DIAGNOSIS — Z940 Kidney transplant status: Principal | ICD-10-CM

## 2020-11-30 LAB — TACROLIMUS LEVEL: TACROLIMUS BLOOD: 7.2 ng/mL (ref 2.0–20.0)

## 2020-12-03 DIAGNOSIS — Z940 Kidney transplant status: Principal | ICD-10-CM

## 2020-12-03 DIAGNOSIS — D849 Immunodeficiency, unspecified: Principal | ICD-10-CM

## 2020-12-03 NOTE — Unmapped (Signed)
[facility] Refill Coordination Note    Specialty Medication(s) to be Shipped:   Transplant: Atovaquone suspension    Other medication(s) to be shipped: No additional medications requested for fill at this time     Charles Huff, DOB: 03-16-1991  Phone: [phone] (home) [phone] (work)      All above HIPAA information was verified with patient.     Was a Nurse, learning disability used for this call? No    Completed refill call assessment today to schedule patient's medication shipment from the [facility] [phone]).       Specialty medication(s) and dose(s) confirmed: Regimen is correct and unchanged.   Changes to medications: Charles Huff reports no changes at this time.  Changes to insurance: No  Questions for the pharmacist: No    Confirmed patient received Welcome Packet with first shipment. The patient will receive a drug information handout for each medication shipped and additional FDA Medication Guides as required.       DISEASE/MEDICATION-SPECIFIC INFORMATION        N/A    SPECIALTY MEDICATION ADHERENCE     Medication Adherence    Patient reported X missed doses in the last month: 0  Specialty Medication: Atovaquone 750mg /10ml  Patient is on additional specialty medications: No  Patient is on more than two specialty medications: No                Atovaquone 750 mg/ml: 7 days of medicine on hand         SHIPPING     Shipping address confirmed in Epic.     Delivery Scheduled: Yes, Expected medication delivery date: 12/05/20.     Medication will be delivered via UPS to the prescription address in Epic WAM.    Charles Huff [facility] Specialty Technician

## 2020-12-04 DIAGNOSIS — D849 Immunodeficiency, unspecified: Principal | ICD-10-CM

## 2020-12-04 DIAGNOSIS — Z940 Kidney transplant status: Principal | ICD-10-CM

## 2020-12-04 LAB — RENAL FUNCTION PANEL
ALBUMIN: 4.5 g/dL (ref 4.1–5.2)
BLOOD UREA NITROGEN: 23 mg/dL — ABNORMAL HIGH (ref 6–20)
BUN / CREAT RATIO: 14 (ref 9–20)
CALCIUM: 9 mg/dL (ref 8.7–10.2)
CHLORIDE: 107 mmol/L — ABNORMAL HIGH (ref 96–106)
CO2: 19 mmol/L — ABNORMAL LOW (ref 20–29)
CREATININE: 1.59 mg/dL — ABNORMAL HIGH (ref 0.76–1.27)
GFR MDRD AF AMER: 68 mL/min/{1.73_m2}
GFR MDRD NON AF AMER: 59 mL/min/{1.73_m2} — ABNORMAL LOW
GLUCOSE: 94 mg/dL (ref 65–99)
PHOSPHORUS, SERUM: 4.5 mg/dL — ABNORMAL HIGH (ref 2.8–4.1)
POTASSIUM: 4.4 mmol/L (ref 3.5–5.2)
SODIUM: 141 mmol/L (ref 134–144)

## 2020-12-04 LAB — CBC W/ DIFFERENTIAL
BANDED NEUTROPHILS ABSOLUTE COUNT: 0 10*3/uL (ref 0.0–0.1)
BASOPHILS ABSOLUTE COUNT: 0 10*3/uL (ref 0.0–0.2)
BASOPHILS RELATIVE PERCENT: 1 %
EOSINOPHILS ABSOLUTE COUNT: 0.1 10*3/uL (ref 0.0–0.4)
EOSINOPHILS RELATIVE PERCENT: 3 %
HEMATOCRIT: 29.7 % — ABNORMAL LOW (ref 37.5–51.0)
HEMOGLOBIN: 10.2 g/dL — ABNORMAL LOW (ref 13.0–17.7)
IMMATURE GRANULOCYTES: 0 %
LYMPHOCYTES ABSOLUTE COUNT: 0.1 10*3/uL — ABNORMAL LOW (ref 0.7–3.1)
LYMPHOCYTES RELATIVE PERCENT: 3 %
MEAN CORPUSCULAR HEMOGLOBIN CONC: 34.3 g/dL (ref 31.5–35.7)
MEAN CORPUSCULAR HEMOGLOBIN: 31.2 pg (ref 26.6–33.0)
MEAN CORPUSCULAR VOLUME: 91 fL (ref 79–97)
MONOCYTES ABSOLUTE COUNT: 0.3 10*3/uL (ref 0.1–0.9)
MONOCYTES RELATIVE PERCENT: 9 %
NEUTROPHILS ABSOLUTE COUNT: 2.6 10*3/uL (ref 1.4–7.0)
NEUTROPHILS RELATIVE PERCENT: 84 %
PLATELET COUNT: 173 10*3/uL (ref 150–450)
RED BLOOD CELL COUNT: 3.27 x10E6/uL — ABNORMAL LOW (ref 4.14–5.80)
RED CELL DISTRIBUTION WIDTH: 12.5 % (ref 11.6–15.4)
WHITE BLOOD CELL COUNT: 3.1 10*3/uL — ABNORMAL LOW (ref 3.4–10.8)

## 2020-12-04 LAB — MAGNESIUM: MAGNESIUM: 2.1 mg/dL (ref 1.6–2.3)

## 2020-12-04 MED FILL — ATOVAQUONE 750 MG/5 ML ORAL SUSPENSION: ORAL | 30 days supply | Qty: 300 | Fill #1

## 2020-12-06 DIAGNOSIS — D84821 Immunocompromised state due to drug therapy (CMS-HCC): Principal | ICD-10-CM

## 2020-12-06 DIAGNOSIS — Z79899 Other long term (current) drug therapy: Principal | ICD-10-CM

## 2020-12-06 DIAGNOSIS — Z1159 Encounter for screening for other viral diseases: Principal | ICD-10-CM

## 2020-12-06 DIAGNOSIS — Z940 Kidney transplant status: Principal | ICD-10-CM

## 2020-12-06 DIAGNOSIS — D849 Immunodeficiency, unspecified: Principal | ICD-10-CM

## 2020-12-07 ENCOUNTER — Ambulatory Visit
Admit: 2020-12-07 | Discharge: 2020-12-07 | Payer: PRIVATE HEALTH INSURANCE | Attending: Nephrology | Primary: Nephrology

## 2020-12-07 ENCOUNTER — Encounter: Admit: 2020-12-07 | Discharge: 2020-12-07 | Payer: PRIVATE HEALTH INSURANCE

## 2020-12-07 DIAGNOSIS — G935 Compression of brain: Principal | ICD-10-CM

## 2020-12-07 DIAGNOSIS — D72819 Decreased white blood cell count, unspecified: Principal | ICD-10-CM

## 2020-12-07 DIAGNOSIS — Z7982 Long term (current) use of aspirin: Principal | ICD-10-CM

## 2020-12-07 DIAGNOSIS — Z4822 Encounter for aftercare following kidney transplant: Principal | ICD-10-CM

## 2020-12-07 DIAGNOSIS — Z940 Kidney transplant status: Principal | ICD-10-CM

## 2020-12-07 DIAGNOSIS — Z1159 Encounter for screening for other viral diseases: Principal | ICD-10-CM

## 2020-12-07 DIAGNOSIS — D649 Anemia, unspecified: Principal | ICD-10-CM

## 2020-12-07 DIAGNOSIS — G43909 Migraine, unspecified, not intractable, without status migrainosus: Principal | ICD-10-CM

## 2020-12-07 DIAGNOSIS — Z79899 Other long term (current) drug therapy: Principal | ICD-10-CM

## 2020-12-07 DIAGNOSIS — E039 Hypothyroidism, unspecified: Principal | ICD-10-CM

## 2020-12-07 DIAGNOSIS — D84821 Immunocompromised state due to drug therapy (CMS-HCC): Principal | ICD-10-CM

## 2020-12-07 DIAGNOSIS — E872 Acidosis: Principal | ICD-10-CM

## 2020-12-07 DIAGNOSIS — E7204 Cystinosis: Principal | ICD-10-CM

## 2020-12-07 DIAGNOSIS — D849 Immunodeficiency, unspecified: Principal | ICD-10-CM

## 2020-12-07 LAB — PHOSPHORUS: PHOSPHORUS: 4.8 mg/dL (ref 2.4–5.1)

## 2020-12-07 LAB — TACROLIMUS LEVEL: TACROLIMUS BLOOD: 7.4 ng/mL (ref 2.0–20.0)

## 2020-12-07 LAB — CBC W/ AUTO DIFF
BASOPHILS ABSOLUTE COUNT: 0 10*9/L (ref 0.0–0.1)
BASOPHILS RELATIVE PERCENT: 0.3 %
EOSINOPHILS ABSOLUTE COUNT: 0.1 10*9/L (ref 0.0–0.7)
EOSINOPHILS RELATIVE PERCENT: 2.2 %
HEMATOCRIT: 31 % — ABNORMAL LOW (ref 38.0–50.0)
HEMOGLOBIN: 10.2 g/dL — ABNORMAL LOW (ref 13.5–17.5)
LYMPHOCYTES ABSOLUTE COUNT: 0.1 10*9/L — ABNORMAL LOW (ref 0.7–4.0)
LYMPHOCYTES RELATIVE PERCENT: 3.2 %
MEAN CORPUSCULAR HEMOGLOBIN CONC: 33 g/dL (ref 30.0–36.0)
MEAN CORPUSCULAR HEMOGLOBIN: 30.7 pg (ref 26.0–34.0)
MEAN CORPUSCULAR VOLUME: 93.1 fL (ref 81.0–95.0)
MEAN PLATELET VOLUME: 7.7 fL (ref 7.0–10.0)
MONOCYTES ABSOLUTE COUNT: 0.2 10*9/L (ref 0.1–1.0)
MONOCYTES RELATIVE PERCENT: 6.4 %
NEUTROPHILS ABSOLUTE COUNT: 2.3 10*9/L (ref 1.7–7.7)
NEUTROPHILS RELATIVE PERCENT: 87.9 %
NUCLEATED RED BLOOD CELLS: 0 /100{WBCs} (ref <=4)
PLATELET COUNT: 178 10*9/L (ref 150–450)
RED BLOOD CELL COUNT: 3.33 10*12/L — ABNORMAL LOW (ref 4.32–5.72)
RED CELL DISTRIBUTION WIDTH: 13.9 % (ref 12.0–15.0)
WBC ADJUSTED: 2.7 10*9/L — ABNORMAL LOW (ref 3.5–10.5)

## 2020-12-07 LAB — URINALYSIS
BILIRUBIN UA: NEGATIVE
GLUCOSE UA: 250 — AB
KETONES UA: NEGATIVE
LEUKOCYTE ESTERASE UA: NEGATIVE
NITRITE UA: NEGATIVE
PH UA: 7.5 (ref 5.0–9.0)
RBC UA: 4 /HPF — ABNORMAL HIGH (ref <3)
SPECIFIC GRAVITY UA: 1.015 (ref 1.005–1.030)
SQUAMOUS EPITHELIAL: 2 /HPF (ref 0–5)
UROBILINOGEN UA: 0.2
WBC UA: 2 /HPF — ABNORMAL HIGH (ref <2)

## 2020-12-07 LAB — COMPREHENSIVE METABOLIC PANEL
ALBUMIN: 4.2 g/dL (ref 3.4–5.0)
ALKALINE PHOSPHATASE: 53 U/L (ref 46–116)
ALT (SGPT): 21 U/L (ref 10–49)
ANION GAP: 7 mmol/L (ref 5–14)
AST (SGOT): 17 U/L (ref <=34)
BILIRUBIN TOTAL: 0.6 mg/dL (ref 0.3–1.2)
BLOOD UREA NITROGEN: 26 mg/dL — ABNORMAL HIGH (ref 9–23)
BUN / CREAT RATIO: 16
CALCIUM: 9.1 mg/dL (ref 8.7–10.4)
CHLORIDE: 111 mmol/L — ABNORMAL HIGH (ref 98–107)
CO2: 22.7 mmol/L (ref 20.0–31.0)
CREATININE: 1.63 mg/dL — ABNORMAL HIGH
EGFR CKD-EPI AA MALE: 66 mL/min/{1.73_m2} (ref >=60)
EGFR CKD-EPI NON-AA MALE: 57 mL/min/{1.73_m2} — ABNORMAL LOW (ref >=60)
GLUCOSE RANDOM: 96 mg/dL (ref 70–179)
POTASSIUM: 3.8 mmol/L (ref 3.4–4.5)
PROTEIN TOTAL: 7 g/dL (ref 5.7–8.2)
SODIUM: 141 mmol/L (ref 135–145)

## 2020-12-07 LAB — MAGNESIUM: MAGNESIUM: 1.8 mg/dL (ref 1.6–2.6)

## 2020-12-07 LAB — PROTEIN / CREATININE RATIO, URINE
CREATININE, URINE: 63.7 mg/dL
PROTEIN URINE: 37.3 mg/dL
PROTEIN/CREAT RATIO, URINE: 0.586

## 2020-12-07 LAB — BILIRUBIN, DIRECT: BILIRUBIN DIRECT: 0.2 mg/dL (ref 0.00–0.30)

## 2020-12-07 NOTE — Unmapped (Signed)
Transplant Nephrology Clinic Visit    Assessment and Plan  Charles Huff is a 30 y.o. male with a history of infantile cystinosis who is seen in follow up of his second kidney transplant on 10/01/20 to address immunosuppression and associated medical concerns. 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   - UPC 0.586 today is consistent with recent values. Proteinuria may be partially from native and/or first transplant kidneys.  - cPRA pre-transplant was 0%, DSA screen from 11/09/20 was negative.     Immunosuppression management.   - Tacrolimus trough 8.8 (target 6-10).    - Continue tacrolimus 4 mg bid   - Continue CellCept 750 mg bid. Prior to second transplant he was on Myfortic 180 mg bid with dose limited by leukopenia. WBC is now 2.7 with ANC 2.3.   - Continue prednisone 5 mg daily.      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  - Continue atovaquone due to history of sulfa allergy and dapsone associated hemolysis   - Evusheld administered 11/09/20    Immunization status  - COVID-19 vaccine x 3 (05/25/20, 06/15/20, 10/18/20), had COVID-19 infection November 2020.   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 Next lab to be done just prior to dose rather than the day following dosing to see if needs to go to daily dosing.    Cystinosis.   - Continue Cystagon 450 mg QID.   - He plans to start taking eye gtts. Optho consult confirmed diffuse stromal crystals bilaterally   - Will check WBC cystine periodically (most recent 0.36 on 11/09/19).   - Patient wishes [facility] be [facility] in the next few months.    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  - Continue Synthroid 50 mcg.   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    - WBC is 2.7 with ANC 2.3.   - Leukopenia limited the mycophenolate dosing after first transplant.   - Will continue monitor WBC on higher dose anti-metabolite therapy (CellCept 750 mg bid versus Myfortic 180 bid)     History of COVID-19 infection, 08/2019.   - Initially had palpitations that resolved.  - No long-term sequelae.   -  S/P COVID vaccine x 3 after infection.  - COVID-19 IgG antibody positive 11/09/20   - Evusheld administered 11/09/20  - He is aware of the limited efficacy of the COVID-19 vaccine    Follow up.   - 6 weeks  - Labs weekly     History of Present Illness:     Authur Huff is a 29 y.o. 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 He was hospitalized 10/11/20-10/13/20 after presenting with hemoglobin 6.2, transient hypoxemia, and severe headache. Methemoblobin level was 10.0 and haptoglobin <1. His  anemia was felt to be due to a combination of CKD and dapsone. He was transfused 2 units pRBCs and switched to atovaquone prophylaxis from Dapsone. Brain MRI revealed a Chiari-1 malformation with slight distention of the optic nerve sheath. Neurology was consulted and they felt his headache was due to a migraine and that the Chiari-1 malformation was not contributory to his headaches. They did not suggest neurology follow up was necessary. Ophthalmology consult revealed no papilledema to suggest pseudotumor cerebri.    He presents today without complaints. He denies tremors and has only occasional mild headache. He denies dysuria, gross hematuria, or fever. He denies cough, hemoptysis, dyspnea on exertion, chest pain or edema. He denies nausea or vomiting. He still has mild intermittent diarrhea.      Transplant History:  - Native kidney disease - cystinosis  - Transplant #1, 08/05/16 living donor transplant:  ?? Father living donor  ?? Delayed graft function   ?? Creatinine never below 3.0 mg/dL. Creatinine baseline prior to second transplant 3.7-4.5 mg/dL.    ?? Induction - Campath and Solumedrol\  ?? Maintenance therapy tacrolimus and Myfortic.  ?? Serologies CMV D+/R-, EBV D+/R- prior to first transplant. EBV never seroconverted.    ?? Kidney biopsies on 08/14/16, 09/04/2016, and 10/10/2016 revealed ATN, atypical epithelial cells with intracellular vacuolization, electron microscopic findings of unusual mitochondria, and findings of early calcineurin inhibitor toxicity.   ?? Kidney biopsy 08/10/2017 revealed diffuse IF/TA without other abnormalities.   ?? No rejection or DSAs after first transplant   ?? Complication: CMV infection  ?? Urothelial thickening with an indeterminate 7 mm nodular focus concerning for possible urothelial lesion. This was almost completely resolved on follow-up US 12/02/2017 and not noted on 11/17/18 or 11/09/19.    - Transplant # 2, deceased donor transplant 10/18/2020:  ?? Pre-transplant cPRA 0%  ?? KDPI 13%, cold ischemia time  ?? CMV D-R+, EBV D+R-  ?? Implantation biopsy 2020/10/18 - no pathology noted  ?? Induction agent - Campath  ?? Maintenance IS on discharge - tacrolimus, CellCept, prednisone  ?? No delayed graft function  ?? Complicated by atelectasis with cough, hemoptysis  ?? Readmitted for dapsone associated anemia on 10/13/20    Review of Systems    All other systems are reviewed and are negative. A 10 systems review was completed.    Medications    Current Outpatient Medications   Medication Sig Dispense Refill   ??? aspirin (ECOTRIN) 81 MG tablet Take 1 tablet (81 mg total) by mouth daily. 90 tablet 3   ??? atovaquone (MEPRON) 750 mg/5 mL suspension Take 10 mL (1,500 mg total) by mouth daily. 300 mL 5   ??? cholecalciferol, vitamin D3-50 mcg, 2,000 unit,, 50 mcg (2,000 unit) tablet Take 2 tablets (100 mcg total) by mouth daily. 60 tablet  11   ??? cysteamine bitartrate (CYSTAGON) 150 mg cap TAKE 3 CAPSULES BY MOUTH 4 TIMES A DAY 360 capsule 8   ??? levothyroxine (SYNTHROID) 50 MCG tablet Take 1 tablet (50 mcg total) by mouth nightly. 30 tablet 11   ??? magnesium oxide-Mg AA chelate (MAGNESIUM, AMINO ACID CHELATE,) 133 mg Tab Take 1 tablet by mouth two (2) times a day. 100 tablet 5   ??? mycophenolate (CELLCEPT) 250 mg capsule Take 3 capsules (750 mg total) by mouth Two (2) times a day. 180 capsule 11   ??? potassium & sodium citrate-citric acid (TRICITRATES) 550-500-334 mg/5 mL Soln Take 60 mL by mouth Two (2) times a week. (Patient taking differently: Take 60 mL by mouth daily. ) 473 mL 5   ??? tacrolimus (PROGRAF) 1 MG capsule Take 4 capsules (4 mg total) by mouth two (2) times a day. 240 capsule 11   ??? valGANciclovir (VALCYTE) 450 mg tablet Take 2 tablets (900 mg total) by mouth daily. 60 tablet 11   ??? acetaminophen (TYLENOL) 500 MG tablet Take 1-2 tablets (500-1,000 mg total) by mouth every six (6) hours as needed for pain or fever. 50 tablet 0   ??? cysteamine HCL (CYSTADROPS) 0.37 % Drop Apply 1 drop to eye four (4) times a day. Apply 1 drop to eye 4 times daily for 30 days     ??? loratadine (CLARITIN) 10 mg tablet Take 1 tablet (10 mg total) by mouth daily as needed for allergies. 30 tablet 0   ??? polyethylene glycol (GLYCOLAX) 17 gram/dose powder Mix 1 capful (17 grams) in 4-8 ounces of water,juice or tea and drink daily as needed for constipation 510 g 0     No current facility-administered medications for this visit.       Physical Exam    Temp 36.8 ??C (98.3 ??F)  - Ht 172.7 cm (5' 8)  - Wt 67.5 kg (148 lb 12.8 oz)  - BMI 22.62 kg/m??   General: Patient is a pleasant male in no apparent distress.  Eyes: Sclera anicteric.  Neck: Supple without LAD/JVD/bruits.  Lungs: Clear to auscultation bilaterally, no wheezes/rales/rhonchi.  Cardiovascular: Regular rate and rhythm without murmurs, rubs or gallops.  Abdomen: Incision site healed  Extremities: Without edema, joints without evidence of synovitis  Skin: Without rash  Neurological: Grossly nonfocal.  Psychiatric: Mood and affect appropriate.    Laboratory Results and Imaging Reviewed in Epic

## 2020-12-07 NOTE — Unmapped (Signed)
AOBP Right arm Medium cuff  Average:130/79 Pulse: 88  1st reading:124/73       Pulse:90  2nd reading:130/81      Pulse:89  3rd reading:135/82       Pulse:85

## 2020-12-08 LAB — TACROLIMUS LEVEL, TROUGH: TACROLIMUS, TROUGH: 8.8 ng/mL (ref 5.0–15.0)

## 2020-12-10 DIAGNOSIS — D849 Immunodeficiency, unspecified: Principal | ICD-10-CM

## 2020-12-10 DIAGNOSIS — Z940 Kidney transplant status: Principal | ICD-10-CM

## 2020-12-10 LAB — CMV DNA, QUANTITATIVE, PCR
CMV QUANT LOG10: 2.75 {Log_IU}/mL — ABNORMAL HIGH (ref <0.00)
CMV QUANT: 556 [IU]/mL — ABNORMAL HIGH (ref <0)

## 2020-12-11 DIAGNOSIS — D849 Immunodeficiency, unspecified: Principal | ICD-10-CM

## 2020-12-11 DIAGNOSIS — Z940 Kidney transplant status: Principal | ICD-10-CM

## 2020-12-12 DIAGNOSIS — B259 Cytomegaloviral disease, unspecified: Principal | ICD-10-CM

## 2020-12-12 DIAGNOSIS — Z940 Kidney transplant status: Principal | ICD-10-CM

## 2020-12-12 DIAGNOSIS — Z1159 Encounter for screening for other viral diseases: Principal | ICD-10-CM

## 2020-12-12 LAB — CBC W/ DIFFERENTIAL
BANDED NEUTROPHILS ABSOLUTE COUNT: 0.1 10*3/uL (ref 0.0–0.1)
BASOPHILS ABSOLUTE COUNT: 0 10*3/uL (ref 0.0–0.2)
BASOPHILS RELATIVE PERCENT: 1 %
EOSINOPHILS ABSOLUTE COUNT: 0.1 10*3/uL (ref 0.0–0.4)
EOSINOPHILS RELATIVE PERCENT: 1 %
HEMATOCRIT: 31.4 % — ABNORMAL LOW (ref 37.5–51.0)
HEMOGLOBIN: 10.4 g/dL — ABNORMAL LOW (ref 13.0–17.7)
IMMATURE GRANULOCYTES: 1 %
LYMPHOCYTES ABSOLUTE COUNT: 0.1 10*3/uL — ABNORMAL LOW (ref 0.7–3.1)
LYMPHOCYTES RELATIVE PERCENT: 3 %
MEAN CORPUSCULAR HEMOGLOBIN CONC: 33.1 g/dL (ref 31.5–35.7)
MEAN CORPUSCULAR HEMOGLOBIN: 31.5 pg (ref 26.6–33.0)
MEAN CORPUSCULAR VOLUME: 95 fL (ref 79–97)
MONOCYTES ABSOLUTE COUNT: 0.2 10*3/uL (ref 0.1–0.9)
MONOCYTES RELATIVE PERCENT: 7 %
NEUTROPHILS ABSOLUTE COUNT: 3.2 10*3/uL (ref 1.4–7.0)
NEUTROPHILS RELATIVE PERCENT: 87 %
PLATELET COUNT: 190 10*3/uL (ref 150–450)
RED BLOOD CELL COUNT: 3.3 x10E6/uL — ABNORMAL LOW (ref 4.14–5.80)
RED CELL DISTRIBUTION WIDTH: 12.7 % (ref 11.6–15.4)
WHITE BLOOD CELL COUNT: 3.6 10*3/uL (ref 3.4–10.8)

## 2020-12-12 LAB — RENAL FUNCTION PANEL
ALBUMIN: 4.5 g/dL (ref 4.1–5.2)
BLOOD UREA NITROGEN: 25 mg/dL — ABNORMAL HIGH (ref 6–20)
BUN / CREAT RATIO: 15 (ref 9–20)
CALCIUM: 8.7 mg/dL (ref 8.7–10.2)
CHLORIDE: 108 mmol/L — ABNORMAL HIGH (ref 96–106)
CO2: 17 mmol/L — ABNORMAL LOW (ref 20–29)
CREATININE: 1.72 mg/dL — ABNORMAL HIGH (ref 0.76–1.27)
GFR MDRD AF AMER: 62 mL/min/{1.73_m2}
GFR MDRD NON AF AMER: 53 mL/min/{1.73_m2} — ABNORMAL LOW
GLUCOSE: 92 mg/dL (ref 65–99)
PHOSPHORUS, SERUM: 4.7 mg/dL — ABNORMAL HIGH (ref 2.8–4.1)
POTASSIUM: 4.7 mmol/L (ref 3.5–5.2)
SODIUM: 141 mmol/L (ref 134–144)

## 2020-12-12 LAB — MAGNESIUM: MAGNESIUM: 2.1 mg/dL (ref 1.6–2.3)

## 2020-12-13 ENCOUNTER — Other Ambulatory Visit (HOSPITAL_COMMUNITY): Payer: Self-pay | Admitting: Nephrology

## 2020-12-13 DIAGNOSIS — D849 Immunodeficiency, unspecified: Principal | ICD-10-CM

## 2020-12-13 DIAGNOSIS — Z940 Kidney transplant status: Principal | ICD-10-CM

## 2020-12-13 MED ORDER — PREDNISONE 5 MG TABLET
ORAL_TABLET | Freq: Every day | ORAL | 11 refills | 30.00000 days | Status: CP
Start: 2020-12-13 — End: 2021-12-13
  Filled 2020-12-19: qty 30, 30d supply, fill #0

## 2020-12-13 NOTE — Unmapped (Signed)
[facility] Refill Coordination Note    Specialty Medication(s) to be Shipped:   Transplant: mycophenolate mofetil 250mg , tacrolimus 1mg , valgancyclovir 450mg  and Prednisone 5mg     Other medication(s) to be shipped: No additional medications requested for fill at this time     Charles Huff, DOB: 1991-05-13  Phone: [phone] (home) [phone] (work)      All above HIPAA information was verified with patient.     Was a Nurse, learning disability used for this call? No    Completed refill call assessment today to schedule patient's medication shipment from the [facility] [phone]).       Specialty medication(s) and dose(s) confirmed: Regimen is correct and unchanged.   Changes to medications: [place] reports no changes at this time.  Changes to insurance: No  Questions for the pharmacist: No    Confirmed patient received Welcome Packet with first shipment. The patient will receive a drug information handout for each medication shipped and additional FDA Medication Guides as required.       DISEASE/MEDICATION-SPECIFIC INFORMATION        N/A    SPECIALTY MEDICATION ADHERENCE     Medication Adherence    Patient reported X missed doses in the last month: 0  Specialty Medication: mycophenolate 250mg   Patient is on additional specialty medications: Yes  Additional Specialty Medications: Tacrolimus 1mg   Patient Reported Additional Medication X Missed Doses in the Last Month: 0  Patient is on more than two specialty medications: Yes  Specialty Medication: valganciclovir 450mg   Patient Reported Additional Medication X Missed Doses in the Last Month: 0  Any gaps in refill history greater than 2 weeks in the last 3 months: no  Demonstrates understanding of importance of adherence: yes  Informant: patient  Reliability of informant: reliable  Provider-estimated medication adherence level: good  Patient is at risk for Non-Adherence: No                mycophenolate 250 mg: 14 days of medicine on hand   prednisone 5 mg: 14 days of medicine on hand   Tacrolimus 1 mg: 14 days of medicine on hand   valganciclovir 450 mg: 14 days of medicine on hand         SHIPPING     Shipping address confirmed in Epic.     Delivery Scheduled: Yes, Expected medication delivery date: 02/23.     Medication will be delivered via Next Day Courier to the prescription address in Epic WAM.    Antonietta Barcelona   [facility] Specialty Technician

## 2020-12-14 ENCOUNTER — Encounter: Admit: 2020-12-14 | Discharge: 2020-12-15 | Payer: PRIVATE HEALTH INSURANCE

## 2020-12-14 LAB — CBC W/ AUTO DIFF
BASOPHILS ABSOLUTE COUNT: 0 10*9/L (ref 0.0–0.1)
BASOPHILS RELATIVE PERCENT: 0.7 %
EOSINOPHILS ABSOLUTE COUNT: 0.1 10*9/L (ref 0.0–0.7)
EOSINOPHILS RELATIVE PERCENT: 1.9 %
HEMATOCRIT: 29.1 % — ABNORMAL LOW (ref 38.0–50.0)
HEMOGLOBIN: 9.8 g/dL — ABNORMAL LOW (ref 13.5–17.5)
LYMPHOCYTES ABSOLUTE COUNT: 0.1 10*9/L — ABNORMAL LOW (ref 0.7–4.0)
LYMPHOCYTES RELATIVE PERCENT: 2.8 %
MEAN CORPUSCULAR HEMOGLOBIN CONC: 33.7 g/dL (ref 30.0–36.0)
MEAN CORPUSCULAR HEMOGLOBIN: 31.1 pg (ref 26.0–34.0)
MEAN CORPUSCULAR VOLUME: 92.2 fL (ref 81.0–95.0)
MEAN PLATELET VOLUME: 7.6 fL (ref 7.0–10.0)
MONOCYTES ABSOLUTE COUNT: 0.2 10*9/L (ref 0.1–1.0)
MONOCYTES RELATIVE PERCENT: 7 %
NEUTROPHILS ABSOLUTE COUNT: 2.4 10*9/L (ref 1.7–7.7)
NEUTROPHILS RELATIVE PERCENT: 87.6 %
NUCLEATED RED BLOOD CELLS: 0 /100{WBCs} (ref <=4)
PLATELET COUNT: 175 10*9/L (ref 150–450)
RED BLOOD CELL COUNT: 3.16 10*12/L — ABNORMAL LOW (ref 4.32–5.72)
RED CELL DISTRIBUTION WIDTH: 13.3 % (ref 12.0–15.0)
WBC ADJUSTED: 2.7 10*9/L — ABNORMAL LOW (ref 3.5–10.5)

## 2020-12-14 LAB — RENAL FUNCTION PANEL
ALBUMIN: 4 g/dL (ref 3.4–5.0)
ANION GAP: 7 mmol/L (ref 5–14)
BLOOD UREA NITROGEN: 22 mg/dL (ref 9–23)
BUN / CREAT RATIO: 16
CALCIUM: 9.1 mg/dL (ref 8.7–10.4)
CHLORIDE: 103 mmol/L (ref 98–107)
CO2: 24.9 mmol/L (ref 20.0–31.0)
CREATININE: 1.41 mg/dL — ABNORMAL HIGH
EGFR CKD-EPI AA MALE: 78 mL/min/{1.73_m2} (ref >=60)
EGFR CKD-EPI NON-AA MALE: 68 mL/min/{1.73_m2} (ref >=60)
GLUCOSE RANDOM: 96 mg/dL (ref 70–179)
PHOSPHORUS: 4.6 mg/dL (ref 2.4–5.1)
POTASSIUM: 4.3 mmol/L (ref 3.4–4.5)
SODIUM: 135 mmol/L (ref 135–145)

## 2020-12-14 LAB — TACROLIMUS LEVEL: TACROLIMUS BLOOD: 8.9 ng/mL (ref 2.0–20.0)

## 2020-12-14 LAB — SLIDE REVIEW

## 2020-12-14 LAB — MAGNESIUM: MAGNESIUM: 1.9 mg/dL (ref 1.6–2.6)

## 2020-12-15 LAB — TACROLIMUS LEVEL: TACROLIMUS BLOOD: 8.4 ng/mL

## 2020-12-17 DIAGNOSIS — Z940 Kidney transplant status: Principal | ICD-10-CM

## 2020-12-17 DIAGNOSIS — D849 Immunodeficiency, unspecified: Principal | ICD-10-CM

## 2020-12-18 DIAGNOSIS — D849 Immunodeficiency, unspecified: Principal | ICD-10-CM

## 2020-12-18 DIAGNOSIS — Z940 Kidney transplant status: Principal | ICD-10-CM

## 2020-12-19 LAB — RENAL FUNCTION PANEL
ALBUMIN: 4.7 g/dL (ref 4.1–5.2)
BLOOD UREA NITROGEN: 18 mg/dL (ref 6–20)
BUN / CREAT RATIO: 11 (ref 9–20)
CALCIUM: 8.9 mg/dL (ref 8.7–10.2)
CHLORIDE: 103 mmol/L (ref 96–106)
CO2: 18 mmol/L — ABNORMAL LOW (ref 20–29)
CREATININE: 1.59 mg/dL — ABNORMAL HIGH (ref 0.76–1.27)
GFR MDRD AF AMER: 68 mL/min/{1.73_m2}
GFR MDRD NON AF AMER: 59 mL/min/{1.73_m2} — ABNORMAL LOW
GLUCOSE: 95 mg/dL (ref 65–99)
PHOSPHORUS, SERUM: 4.2 mg/dL — ABNORMAL HIGH (ref 2.8–4.1)
POTASSIUM: 4.5 mmol/L (ref 3.5–5.2)
SODIUM: 136 mmol/L (ref 134–144)

## 2020-12-19 LAB — IMMATURE CELLS: METAMYLOCYTES-LABCORP: 4 % — ABNORMAL HIGH (ref 0–0)

## 2020-12-19 LAB — CBC W/ DIFFERENTIAL
BASOPHILS ABSOLUTE COUNT: 0 10*3/uL (ref 0.0–0.2)
BASOPHILS RELATIVE PERCENT: 1 %
EOSINOPHILS ABSOLUTE COUNT: 0 10*3/uL (ref 0.0–0.4)
EOSINOPHILS RELATIVE PERCENT: 2 %
HEMATOCRIT: 31.6 % — ABNORMAL LOW (ref 37.5–51.0)
HEMOGLOBIN: 10.6 g/dL — ABNORMAL LOW (ref 13.0–17.7)
LYMPHOCYTES ABSOLUTE COUNT: 0.1 10*3/uL — ABNORMAL LOW (ref 0.7–3.1)
LYMPHOCYTES RELATIVE PERCENT: 4 %
MEAN CORPUSCULAR HEMOGLOBIN CONC: 33.5 g/dL (ref 31.5–35.7)
MEAN CORPUSCULAR HEMOGLOBIN: 30 pg (ref 26.6–33.0)
MEAN CORPUSCULAR VOLUME: 90 fL (ref 79–97)
MONOCYTES ABSOLUTE COUNT: 0.1 10*3/uL (ref 0.1–0.9)
MONOCYTES RELATIVE PERCENT: 5 %
NEUTROPHILS ABSOLUTE COUNT: 1.9 10*3/uL (ref 1.4–7.0)
NEUTROPHILS RELATIVE PERCENT: 84 %
PLATELET COUNT: 197 10*3/uL (ref 150–450)
RED BLOOD CELL COUNT: 3.53 x10E6/uL — ABNORMAL LOW (ref 4.14–5.80)
RED CELL DISTRIBUTION WIDTH: 12.2 % (ref 11.6–15.4)
WHITE BLOOD CELL COUNT: 2.3 10*3/uL — CL (ref 3.4–10.8)

## 2020-12-19 LAB — MAGNESIUM: MAGNESIUM: 2.2 mg/dL (ref 1.6–2.3)

## 2020-12-19 MED FILL — MYCOPHENOLATE MOFETIL 250 MG CAPSULE: ORAL | 30 days supply | Qty: 180 | Fill #2

## 2020-12-19 MED FILL — VALGANCICLOVIR 450 MG TABLET: ORAL | 30 days supply | Qty: 60 | Fill #1

## 2020-12-19 MED FILL — TACROLIMUS 1 MG CAPSULE, IMMEDIATE-RELEASE: ORAL | 30 days supply | Qty: 240 | Fill #2

## 2020-12-20 DIAGNOSIS — Z940 Kidney transplant status: Principal | ICD-10-CM

## 2020-12-20 DIAGNOSIS — D849 Immunodeficiency, unspecified: Principal | ICD-10-CM

## 2020-12-21 LAB — TACROLIMUS LEVEL: TACROLIMUS BLOOD: 9.4 ng/mL (ref 2.0–20.0)

## 2020-12-21 NOTE — Unmapped (Signed)
This patient has been disenrolled from the [facility] specialty pharmacy services due to a pharmacy change. The patient is now filling at [facility] per coordinator LF.  Clinic gave patient our phone number for [facility] to call and obtain transfers, also recommended clinic may want to send new rxs to [facility] to expedite process if needed. Clinic aware to reach out to [place] if anything changes, or they need [place] to contact patient again.Thad Ranger  [facility] Specialty Pharmacist

## 2020-12-24 DIAGNOSIS — N185 Chronic kidney disease, stage 5: Principal | ICD-10-CM

## 2020-12-24 DIAGNOSIS — Z940 Kidney transplant status: Principal | ICD-10-CM

## 2020-12-24 DIAGNOSIS — D849 Immunodeficiency, unspecified: Principal | ICD-10-CM

## 2020-12-25 DIAGNOSIS — Z940 Kidney transplant status: Principal | ICD-10-CM

## 2020-12-25 DIAGNOSIS — D849 Immunodeficiency, unspecified: Principal | ICD-10-CM

## 2020-12-27 DIAGNOSIS — D849 Immunodeficiency, unspecified: Principal | ICD-10-CM

## 2020-12-27 DIAGNOSIS — Z940 Kidney transplant status: Principal | ICD-10-CM

## 2020-12-27 MED ORDER — LEVOTHYROXINE 50 MCG TABLET
ORAL_TABLET | 0 refills | 0 days
Start: 2020-12-27 — End: ?

## 2020-12-28 ENCOUNTER — Ambulatory Visit: Admit: 2020-12-28 | Discharge: 2020-12-29 | Payer: PRIVATE HEALTH INSURANCE

## 2020-12-28 DIAGNOSIS — Z940 Kidney transplant status: Secondary | ICD-10-CM | POA: Diagnosis not present

## 2020-12-28 DIAGNOSIS — D849 Immunodeficiency, unspecified: Secondary | ICD-10-CM | POA: Diagnosis not present

## 2020-12-28 DIAGNOSIS — Z1159 Encounter for screening for other viral diseases: Secondary | ICD-10-CM | POA: Diagnosis not present

## 2020-12-28 DIAGNOSIS — B259 Cytomegaloviral disease, unspecified: Secondary | ICD-10-CM | POA: Diagnosis not present

## 2020-12-28 MED ORDER — TACROLIMUS 1 MG CAPSULE, IMMEDIATE-RELEASE
ORAL_CAPSULE | Freq: Two times a day (BID) | ORAL | 11 refills | 30 days
Start: 2020-12-28 — End: 2021-12-28

## 2020-12-28 MED ORDER — MYCOPHENOLATE MOFETIL 250 MG CAPSULE
ORAL_CAPSULE | Freq: Two times a day (BID) | ORAL | 11 refills | 30 days
Start: 2020-12-28 — End: 2021-12-28

## 2020-12-31 ENCOUNTER — Ambulatory Visit: Admit: 2020-12-31 | Discharge: 2021-01-01 | Payer: PRIVATE HEALTH INSURANCE

## 2020-12-31 ENCOUNTER — Other Ambulatory Visit (HOSPITAL_COMMUNITY): Payer: Self-pay | Admitting: Nephrology

## 2020-12-31 DIAGNOSIS — Z940 Kidney transplant status: Secondary | ICD-10-CM | POA: Diagnosis not present

## 2020-12-31 DIAGNOSIS — D849 Immunodeficiency, unspecified: Secondary | ICD-10-CM | POA: Diagnosis not present

## 2020-12-31 MED ORDER — POTAS AND SOD CITRATE-CITRIC ACID 550 MG-500 MG-334 MG/5 ML ORAL SOLN
ORAL | 11 refills | 28 days | Status: CP
Start: 2020-12-31 — End: 2021-12-31

## 2020-12-31 MED ORDER — TACROLIMUS 1 MG CAPSULE, IMMEDIATE-RELEASE
ORAL_CAPSULE | Freq: Two times a day (BID) | ORAL | 11 refills | 30.00000 days | Status: CP
Start: 2020-12-31 — End: 2021-12-31

## 2020-12-31 MED ORDER — MYCOPHENOLATE MOFETIL 250 MG CAPSULE
ORAL_CAPSULE | Freq: Two times a day (BID) | ORAL | 11 refills | 30 days | Status: CP
Start: 2020-12-31 — End: 2021-12-31

## 2021-01-01 DIAGNOSIS — Z940 Kidney transplant status: Principal | ICD-10-CM

## 2021-01-01 DIAGNOSIS — D849 Immunodeficiency, unspecified: Principal | ICD-10-CM

## 2021-01-02 DIAGNOSIS — Z940 Kidney transplant status: Principal | ICD-10-CM

## 2021-01-03 DIAGNOSIS — D849 Immunodeficiency, unspecified: Principal | ICD-10-CM

## 2021-01-03 DIAGNOSIS — Z940 Kidney transplant status: Principal | ICD-10-CM

## 2021-01-04 ENCOUNTER — Ambulatory Visit: Admit: 2021-01-04 | Discharge: 2021-01-05 | Payer: PRIVATE HEALTH INSURANCE

## 2021-01-04 DIAGNOSIS — D849 Immunodeficiency, unspecified: Secondary | ICD-10-CM | POA: Diagnosis not present

## 2021-01-04 DIAGNOSIS — Z940 Kidney transplant status: Secondary | ICD-10-CM | POA: Diagnosis not present

## 2021-01-04 DIAGNOSIS — Z1159 Encounter for screening for other viral diseases: Secondary | ICD-10-CM | POA: Diagnosis not present

## 2021-01-07 ENCOUNTER — Ambulatory Visit: Admit: 2021-01-07 | Discharge: 2021-01-08 | Payer: PRIVATE HEALTH INSURANCE

## 2021-01-07 DIAGNOSIS — D849 Immunodeficiency, unspecified: Secondary | ICD-10-CM | POA: Diagnosis not present

## 2021-01-07 DIAGNOSIS — Z940 Kidney transplant status: Secondary | ICD-10-CM | POA: Diagnosis not present

## 2021-01-07 DIAGNOSIS — Z1159 Encounter for screening for other viral diseases: Secondary | ICD-10-CM | POA: Diagnosis not present

## 2021-01-08 DIAGNOSIS — D849 Immunodeficiency, unspecified: Principal | ICD-10-CM

## 2021-01-08 DIAGNOSIS — Z940 Kidney transplant status: Principal | ICD-10-CM

## 2021-01-10 DIAGNOSIS — Z940 Kidney transplant status: Principal | ICD-10-CM

## 2021-01-10 DIAGNOSIS — D849 Immunodeficiency, unspecified: Principal | ICD-10-CM

## 2021-01-10 DIAGNOSIS — Z79899 Other long term (current) drug therapy: Principal | ICD-10-CM

## 2021-01-10 DIAGNOSIS — Z1159 Encounter for screening for other viral diseases: Principal | ICD-10-CM

## 2021-01-10 DIAGNOSIS — D84821 Immunocompromised state due to drug therapy (CMS-HCC): Principal | ICD-10-CM

## 2021-01-11 ENCOUNTER — Ambulatory Visit: Admit: 2021-01-11 | Discharge: 2021-01-12 | Payer: PRIVATE HEALTH INSURANCE

## 2021-01-11 DIAGNOSIS — D84821 Immunodeficiency due to drugs: Secondary | ICD-10-CM | POA: Diagnosis not present

## 2021-01-11 DIAGNOSIS — Z79899 Other long term (current) drug therapy: Secondary | ICD-10-CM | POA: Diagnosis not present

## 2021-01-11 DIAGNOSIS — Z1159 Encounter for screening for other viral diseases: Secondary | ICD-10-CM | POA: Diagnosis not present

## 2021-01-11 DIAGNOSIS — Z940 Kidney transplant status: Secondary | ICD-10-CM | POA: Diagnosis not present

## 2021-01-14 DIAGNOSIS — D849 Immunodeficiency, unspecified: Principal | ICD-10-CM

## 2021-01-14 DIAGNOSIS — Z940 Kidney transplant status: Principal | ICD-10-CM

## 2021-01-15 DIAGNOSIS — D849 Immunodeficiency, unspecified: Principal | ICD-10-CM

## 2021-01-15 DIAGNOSIS — Z940 Kidney transplant status: Principal | ICD-10-CM

## 2021-01-16 ENCOUNTER — Ambulatory Visit: Admit: 2021-01-16 | Discharge: 2021-01-16 | Payer: PRIVATE HEALTH INSURANCE

## 2021-01-16 ENCOUNTER — Ambulatory Visit
Admit: 2021-01-16 | Discharge: 2021-01-16 | Payer: PRIVATE HEALTH INSURANCE | Attending: Nephrology | Primary: Nephrology

## 2021-01-16 DIAGNOSIS — G935 Compression of brain: Secondary | ICD-10-CM | POA: Diagnosis not present

## 2021-01-16 DIAGNOSIS — Z1159 Encounter for screening for other viral diseases: Secondary | ICD-10-CM | POA: Diagnosis not present

## 2021-01-16 DIAGNOSIS — Z48298 Encounter for aftercare following other organ transplant: Secondary | ICD-10-CM | POA: Diagnosis not present

## 2021-01-16 DIAGNOSIS — D649 Anemia, unspecified: Secondary | ICD-10-CM | POA: Diagnosis not present

## 2021-01-16 DIAGNOSIS — E872 Acidosis: Secondary | ICD-10-CM | POA: Diagnosis not present

## 2021-01-16 DIAGNOSIS — D849 Immunodeficiency, unspecified: Secondary | ICD-10-CM | POA: Diagnosis not present

## 2021-01-16 DIAGNOSIS — Z940 Kidney transplant status: Secondary | ICD-10-CM | POA: Diagnosis not present

## 2021-01-16 DIAGNOSIS — E039 Hypothyroidism, unspecified: Secondary | ICD-10-CM | POA: Diagnosis not present

## 2021-01-16 DIAGNOSIS — D84821 Immunocompromised state due to drug therapy (CMS-HCC): Principal | ICD-10-CM

## 2021-01-16 DIAGNOSIS — E7204 Cystinosis: Secondary | ICD-10-CM | POA: Diagnosis not present

## 2021-01-16 DIAGNOSIS — Z7982 Long term (current) use of aspirin: Secondary | ICD-10-CM | POA: Diagnosis not present

## 2021-01-16 DIAGNOSIS — N289 Disorder of kidney and ureter, unspecified: Secondary | ICD-10-CM | POA: Diagnosis not present

## 2021-01-16 DIAGNOSIS — D72819 Decreased white blood cell count, unspecified: Secondary | ICD-10-CM | POA: Diagnosis not present

## 2021-01-16 DIAGNOSIS — Z79899 Other long term (current) drug therapy: Secondary | ICD-10-CM | POA: Diagnosis not present

## 2021-01-17 ENCOUNTER — Other Ambulatory Visit (HOSPITAL_BASED_OUTPATIENT_CLINIC_OR_DEPARTMENT_OTHER): Payer: Self-pay

## 2021-01-17 DIAGNOSIS — D849 Immunodeficiency, unspecified: Principal | ICD-10-CM

## 2021-01-17 DIAGNOSIS — Z940 Kidney transplant status: Principal | ICD-10-CM

## 2021-01-18 ENCOUNTER — Ambulatory Visit: Admit: 2021-01-18 | Discharge: 2021-01-19 | Payer: PRIVATE HEALTH INSURANCE

## 2021-01-18 DIAGNOSIS — Z940 Kidney transplant status: Secondary | ICD-10-CM | POA: Diagnosis not present

## 2021-01-18 DIAGNOSIS — D849 Immunodeficiency, unspecified: Secondary | ICD-10-CM | POA: Diagnosis not present

## 2021-01-18 DIAGNOSIS — Z1159 Encounter for screening for other viral diseases: Secondary | ICD-10-CM | POA: Diagnosis not present

## 2021-01-21 ENCOUNTER — Ambulatory Visit: Admit: 2021-01-21 | Discharge: 2021-01-22 | Payer: PRIVATE HEALTH INSURANCE

## 2021-01-21 DIAGNOSIS — Z940 Kidney transplant status: Secondary | ICD-10-CM | POA: Diagnosis not present

## 2021-01-21 DIAGNOSIS — D849 Immunodeficiency, unspecified: Secondary | ICD-10-CM | POA: Diagnosis not present

## 2021-01-21 DIAGNOSIS — Z1159 Encounter for screening for other viral diseases: Secondary | ICD-10-CM | POA: Diagnosis not present

## 2021-01-21 DIAGNOSIS — N185 Chronic kidney disease, stage 5: Principal | ICD-10-CM

## 2021-01-21 DIAGNOSIS — E7204 Cystinosis: Principal | ICD-10-CM

## 2021-01-21 MED ORDER — CYSTAGON 150 MG CAPSULE
ORAL_CAPSULE | ORAL | 8 refills | 0.00000 days | Status: CP
Start: 2021-01-21 — End: ?

## 2021-01-22 DIAGNOSIS — D849 Immunodeficiency, unspecified: Principal | ICD-10-CM

## 2021-01-22 DIAGNOSIS — Z940 Kidney transplant status: Principal | ICD-10-CM

## 2021-01-23 ENCOUNTER — Institutional Professional Consult (permissible substitution): Admit: 2021-01-23 | Discharge: 2021-01-23 | Payer: PRIVATE HEALTH INSURANCE

## 2021-01-23 DIAGNOSIS — E039 Hypothyroidism, unspecified: Secondary | ICD-10-CM | POA: Diagnosis not present

## 2021-01-23 DIAGNOSIS — Z79899 Other long term (current) drug therapy: Secondary | ICD-10-CM | POA: Diagnosis not present

## 2021-01-23 DIAGNOSIS — Z940 Kidney transplant status: Secondary | ICD-10-CM | POA: Diagnosis not present

## 2021-01-24 DIAGNOSIS — Z940 Kidney transplant status: Principal | ICD-10-CM

## 2021-01-24 DIAGNOSIS — D849 Immunodeficiency, unspecified: Principal | ICD-10-CM

## 2021-01-25 ENCOUNTER — Ambulatory Visit: Admit: 2021-01-25 | Discharge: 2021-01-26 | Payer: PRIVATE HEALTH INSURANCE

## 2021-01-25 DIAGNOSIS — Z1159 Encounter for screening for other viral diseases: Secondary | ICD-10-CM | POA: Diagnosis not present

## 2021-01-25 DIAGNOSIS — D849 Immunodeficiency, unspecified: Secondary | ICD-10-CM | POA: Diagnosis not present

## 2021-01-25 DIAGNOSIS — Z940 Kidney transplant status: Secondary | ICD-10-CM | POA: Diagnosis not present

## 2021-02-01 ENCOUNTER — Ambulatory Visit: Admit: 2021-02-01 | Discharge: 2021-02-02 | Payer: PRIVATE HEALTH INSURANCE

## 2021-02-01 ENCOUNTER — Other Ambulatory Visit (HOSPITAL_COMMUNITY): Payer: Self-pay

## 2021-02-01 DIAGNOSIS — Z940 Kidney transplant status: Secondary | ICD-10-CM | POA: Diagnosis not present

## 2021-02-01 DIAGNOSIS — D849 Immunodeficiency, unspecified: Secondary | ICD-10-CM | POA: Diagnosis not present

## 2021-02-01 DIAGNOSIS — Z1159 Encounter for screening for other viral diseases: Secondary | ICD-10-CM | POA: Diagnosis not present

## 2021-02-01 MED FILL — Mycophenolate Mofetil Cap 250 MG: ORAL | 30 days supply | Qty: 120 | Fill #0 | Status: AC

## 2021-02-01 MED FILL — Prednisone Tab 5 MG: ORAL | 30 days supply | Qty: 30 | Fill #0 | Status: AC

## 2021-02-01 MED FILL — Aspirin Tab Delayed Release 81 MG: ORAL | 90 days supply | Qty: 90 | Fill #0 | Status: AC

## 2021-02-01 MED FILL — Levothyroxine Sodium Tab 50 MCG: ORAL | 90 days supply | Qty: 90 | Fill #0 | Status: AC

## 2021-02-01 MED FILL — Tacrolimus Cap 1 MG: ORAL | 30 days supply | Qty: 180 | Fill #0 | Status: AC

## 2021-02-08 ENCOUNTER — Ambulatory Visit: Admit: 2021-02-08 | Discharge: 2021-02-09 | Payer: PRIVATE HEALTH INSURANCE

## 2021-02-08 DIAGNOSIS — Z940 Kidney transplant status: Secondary | ICD-10-CM | POA: Diagnosis not present

## 2021-02-08 DIAGNOSIS — D849 Immunodeficiency, unspecified: Secondary | ICD-10-CM | POA: Diagnosis not present

## 2021-02-08 DIAGNOSIS — Z1159 Encounter for screening for other viral diseases: Secondary | ICD-10-CM | POA: Diagnosis not present

## 2021-02-15 ENCOUNTER — Ambulatory Visit: Admit: 2021-02-15 | Discharge: 2021-02-16 | Payer: PRIVATE HEALTH INSURANCE

## 2021-02-15 DIAGNOSIS — Z940 Kidney transplant status: Secondary | ICD-10-CM | POA: Diagnosis not present

## 2021-02-15 DIAGNOSIS — Z1159 Encounter for screening for other viral diseases: Secondary | ICD-10-CM | POA: Diagnosis not present

## 2021-02-15 DIAGNOSIS — D849 Immunodeficiency, unspecified: Secondary | ICD-10-CM | POA: Diagnosis not present

## 2021-02-18 DIAGNOSIS — N185 Chronic kidney disease, stage 5: Principal | ICD-10-CM

## 2021-02-18 DIAGNOSIS — Z940 Kidney transplant status: Principal | ICD-10-CM

## 2021-02-19 DIAGNOSIS — Z940 Kidney transplant status: Principal | ICD-10-CM

## 2021-02-19 MED ORDER — TACROLIMUS 1 MG CAPSULE, IMMEDIATE-RELEASE
ORAL_CAPSULE | Freq: Two times a day (BID) | ORAL | 11 refills | 30.00000 days | Status: CP
Start: 2021-02-19 — End: 2022-02-19

## 2021-02-22 ENCOUNTER — Ambulatory Visit: Admit: 2021-02-22 | Discharge: 2021-02-23 | Payer: PRIVATE HEALTH INSURANCE

## 2021-02-22 DIAGNOSIS — D84821 Immunodeficiency due to drugs: Secondary | ICD-10-CM | POA: Diagnosis not present

## 2021-02-22 DIAGNOSIS — Z1159 Encounter for screening for other viral diseases: Secondary | ICD-10-CM | POA: Diagnosis not present

## 2021-02-22 DIAGNOSIS — D849 Immunodeficiency, unspecified: Secondary | ICD-10-CM | POA: Diagnosis not present

## 2021-02-22 DIAGNOSIS — Z940 Kidney transplant status: Secondary | ICD-10-CM | POA: Diagnosis not present

## 2021-02-22 DIAGNOSIS — Z79899 Other long term (current) drug therapy: Secondary | ICD-10-CM | POA: Diagnosis not present

## 2021-02-26 ENCOUNTER — Other Ambulatory Visit (HOSPITAL_COMMUNITY): Payer: Self-pay

## 2021-03-01 ENCOUNTER — Ambulatory Visit: Admit: 2021-03-01 | Discharge: 2021-03-02 | Payer: PRIVATE HEALTH INSURANCE

## 2021-03-01 DIAGNOSIS — D84821 Immunodeficiency due to drugs: Secondary | ICD-10-CM | POA: Diagnosis not present

## 2021-03-01 DIAGNOSIS — D849 Immunodeficiency, unspecified: Secondary | ICD-10-CM | POA: Diagnosis not present

## 2021-03-01 DIAGNOSIS — Z1159 Encounter for screening for other viral diseases: Secondary | ICD-10-CM | POA: Diagnosis not present

## 2021-03-01 DIAGNOSIS — Z79899 Other long term (current) drug therapy: Secondary | ICD-10-CM | POA: Diagnosis not present

## 2021-03-01 DIAGNOSIS — Z940 Kidney transplant status: Secondary | ICD-10-CM | POA: Diagnosis not present

## 2021-03-05 ENCOUNTER — Other Ambulatory Visit (HOSPITAL_COMMUNITY): Payer: Self-pay

## 2021-03-05 MED FILL — Prednisone Tab 5 MG: ORAL | 30 days supply | Qty: 30 | Fill #1 | Status: AC

## 2021-03-05 MED FILL — Mycophenolate Mofetil Cap 250 MG: ORAL | 30 days supply | Qty: 120 | Fill #1 | Status: AC

## 2021-03-05 MED FILL — Tacrolimus Cap 1 MG: ORAL | 30 days supply | Qty: 180 | Fill #1 | Status: AC

## 2021-03-05 MED FILL — Aspirin Tab Delayed Release 81 MG: ORAL | 90 days supply | Qty: 90 | Fill #1 | Status: CN

## 2021-03-08 DIAGNOSIS — Z940 Kidney transplant status: Secondary | ICD-10-CM | POA: Diagnosis not present

## 2021-03-08 DIAGNOSIS — D849 Immunodeficiency, unspecified: Secondary | ICD-10-CM | POA: Diagnosis not present

## 2021-03-12 ENCOUNTER — Other Ambulatory Visit (HOSPITAL_COMMUNITY): Payer: Self-pay

## 2021-03-12 MED ORDER — MG-PLUS PROTEIN 133 MG PO TABS
ORAL_TABLET | ORAL | 4 refills | Status: AC
  Filled 2021-03-12: qty 100, 50d supply, fill #0
  Filled 2021-04-17 – 2021-04-18 (×2): qty 100, 50d supply, fill #1
  Filled 2021-06-25: qty 100, 50d supply, fill #2
  Filled 2021-08-21: qty 100, 50d supply, fill #3

## 2021-03-13 ENCOUNTER — Other Ambulatory Visit (HOSPITAL_COMMUNITY): Payer: Self-pay

## 2021-03-15 DIAGNOSIS — D849 Immunodeficiency, unspecified: Secondary | ICD-10-CM | POA: Diagnosis not present

## 2021-03-15 DIAGNOSIS — Z940 Kidney transplant status: Secondary | ICD-10-CM | POA: Diagnosis not present

## 2021-03-15 DIAGNOSIS — D631 Anemia in chronic kidney disease: Principal | ICD-10-CM

## 2021-03-15 DIAGNOSIS — Z79899 Other long term (current) drug therapy: Principal | ICD-10-CM

## 2021-03-15 DIAGNOSIS — N189 Chronic kidney disease, unspecified: Principal | ICD-10-CM

## 2021-03-15 DIAGNOSIS — Z1159 Encounter for screening for other viral diseases: Principal | ICD-10-CM

## 2021-03-15 DIAGNOSIS — E7204 Cystinosis: Principal | ICD-10-CM

## 2021-03-18 ENCOUNTER — Other Ambulatory Visit (HOSPITAL_BASED_OUTPATIENT_CLINIC_OR_DEPARTMENT_OTHER): Payer: Self-pay

## 2021-03-18 DIAGNOSIS — Z940 Kidney transplant status: Principal | ICD-10-CM

## 2021-03-18 DIAGNOSIS — N185 Chronic kidney disease, stage 5: Principal | ICD-10-CM

## 2021-03-22 ENCOUNTER — Ambulatory Visit: Admit: 2021-03-22 | Discharge: 2021-03-22 | Payer: PRIVATE HEALTH INSURANCE

## 2021-03-22 ENCOUNTER — Institutional Professional Consult (permissible substitution): Admit: 2021-03-22 | Discharge: 2021-03-22 | Payer: PRIVATE HEALTH INSURANCE

## 2021-03-22 ENCOUNTER — Ambulatory Visit
Admit: 2021-03-22 | Discharge: 2021-03-22 | Payer: PRIVATE HEALTH INSURANCE | Attending: Nephrology | Primary: Nephrology

## 2021-03-22 ENCOUNTER — Ambulatory Visit: Admit: 2021-03-22 | Discharge: 2021-03-23 | Payer: PRIVATE HEALTH INSURANCE

## 2021-03-22 DIAGNOSIS — Z79899 Other long term (current) drug therapy: Secondary | ICD-10-CM | POA: Diagnosis not present

## 2021-03-22 DIAGNOSIS — Z7982 Long term (current) use of aspirin: Secondary | ICD-10-CM | POA: Diagnosis not present

## 2021-03-22 DIAGNOSIS — D849 Immunodeficiency, unspecified: Secondary | ICD-10-CM | POA: Diagnosis not present

## 2021-03-22 DIAGNOSIS — Z1159 Encounter for screening for other viral diseases: Secondary | ICD-10-CM | POA: Diagnosis not present

## 2021-03-22 DIAGNOSIS — Z23 Encounter for immunization: Secondary | ICD-10-CM | POA: Diagnosis not present

## 2021-03-22 DIAGNOSIS — Z48298 Encounter for aftercare following other organ transplant: Secondary | ICD-10-CM | POA: Diagnosis not present

## 2021-03-22 DIAGNOSIS — E039 Hypothyroidism, unspecified: Secondary | ICD-10-CM | POA: Diagnosis not present

## 2021-03-22 DIAGNOSIS — Z79890 Hormone replacement therapy (postmenopausal): Secondary | ICD-10-CM | POA: Diagnosis not present

## 2021-03-22 DIAGNOSIS — E7204 Cystinosis: Secondary | ICD-10-CM | POA: Diagnosis not present

## 2021-03-22 DIAGNOSIS — Z4822 Encounter for aftercare following kidney transplant: Secondary | ICD-10-CM | POA: Diagnosis not present

## 2021-03-22 DIAGNOSIS — Z940 Kidney transplant status: Secondary | ICD-10-CM | POA: Diagnosis not present

## 2021-03-22 DIAGNOSIS — D631 Anemia in chronic kidney disease: Principal | ICD-10-CM

## 2021-03-22 DIAGNOSIS — N189 Chronic kidney disease, unspecified: Principal | ICD-10-CM

## 2021-03-22 MED ORDER — TACROLIMUS 1 MG CAPSULE, IMMEDIATE-RELEASE
ORAL_CAPSULE | ORAL | 11 refills | 30 days | Status: CP
Start: 2021-03-22 — End: 2022-03-22

## 2021-03-22 MED ORDER — CHOLECALCIFEROL (VITAMIN D3) 50 MCG (2,000 UNIT) TABLET
ORAL_TABLET | Freq: Every day | ORAL | 11 refills | 60.00000 days | Status: CP
Start: 2021-03-22 — End: ?

## 2021-03-27 ENCOUNTER — Ambulatory Visit
Admit: 2021-03-27 | Discharge: 2021-03-28 | Payer: PRIVATE HEALTH INSURANCE | Attending: Dermatology | Primary: Dermatology

## 2021-03-27 ENCOUNTER — Other Ambulatory Visit: Payer: Self-pay

## 2021-03-27 ENCOUNTER — Ambulatory Visit
Admission: EM | Admit: 2021-03-27 | Discharge: 2021-03-27 | Disposition: A | Discharge status: Home / Self Care | Payer: 59

## 2021-03-27 DIAGNOSIS — E038 Other specified hypothyroidism: Secondary | ICD-10-CM | POA: Diagnosis not present

## 2021-03-27 DIAGNOSIS — Q0700 Arnold-Chiari syndrome without spina bifida or hydrocephalus: Secondary | ICD-10-CM | POA: Diagnosis not present

## 2021-03-27 DIAGNOSIS — R21 Rash and other nonspecific skin eruption: Secondary | ICD-10-CM | POA: Diagnosis not present

## 2021-03-27 DIAGNOSIS — D849 Immunodeficiency, unspecified: Secondary | ICD-10-CM | POA: Diagnosis not present

## 2021-03-27 DIAGNOSIS — R519 Headache, unspecified: Secondary | ICD-10-CM | POA: Diagnosis not present

## 2021-03-27 DIAGNOSIS — D72819 Decreased white blood cell count, unspecified: Secondary | ICD-10-CM | POA: Diagnosis not present

## 2021-03-27 DIAGNOSIS — E7204 Cystinosis: Secondary | ICD-10-CM | POA: Diagnosis not present

## 2021-03-27 DIAGNOSIS — E872 Acidosis: Secondary | ICD-10-CM | POA: Diagnosis not present

## 2021-03-27 DIAGNOSIS — D84821 Immunodeficiency due to drugs: Secondary | ICD-10-CM | POA: Diagnosis not present

## 2021-03-27 DIAGNOSIS — Z940 Kidney transplant status: Secondary | ICD-10-CM | POA: Diagnosis not present

## 2021-03-27 DIAGNOSIS — E860 Dehydration: Secondary | ICD-10-CM | POA: Diagnosis not present

## 2021-03-27 DIAGNOSIS — Z1159 Encounter for screening for other viral diseases: Secondary | ICD-10-CM | POA: Diagnosis not present

## 2021-03-27 DIAGNOSIS — H0015 Chalazion left lower eyelid: Secondary | ICD-10-CM | POA: Diagnosis not present

## 2021-03-27 DIAGNOSIS — H0289 Other specified disorders of eyelid: Secondary | ICD-10-CM | POA: Diagnosis not present

## 2021-03-27 DIAGNOSIS — B027 Disseminated zoster: Secondary | ICD-10-CM | POA: Diagnosis not present

## 2021-03-27 DIAGNOSIS — D649 Anemia, unspecified: Secondary | ICD-10-CM | POA: Diagnosis not present

## 2021-03-27 DIAGNOSIS — B09 Unspecified viral infection characterized by skin and mucous membrane lesions: Principal | ICD-10-CM

## 2021-03-27 NOTE — ED Triage Notes (Signed)
Pt sts he have bumps all over. "I think it is chicken pots". Also sts he have been having generalized body aches and headache since Friday.

## 2021-03-27 NOTE — ED Notes (Signed)
Joshua Gower, NP recommend pt receive higher level of acuity.

## 2021-03-28 ENCOUNTER — Ambulatory Visit
Admit: 2021-03-28 | Discharge: 2021-04-01 | Disposition: A | Discharge status: Home / Self Care | Payer: PRIVATE HEALTH INSURANCE | Source: Ambulatory Visit | Admitting: Infectious Disease

## 2021-03-31 MED ORDER — VALACYCLOVIR 1 GRAM TABLET
ORAL_TABLET | Freq: Three times a day (TID) | ORAL | 0 refills | 14 days
Start: 2021-03-31 — End: 2021-04-14

## 2021-04-01 ENCOUNTER — Other Ambulatory Visit (HOSPITAL_BASED_OUTPATIENT_CLINIC_OR_DEPARTMENT_OTHER): Payer: Self-pay

## 2021-04-01 MED ORDER — VALACYCLOVIR HCL 1 G PO TABS
ORAL_TABLET | ORAL | 0 refills | Status: DC
  Filled 2021-04-01 – 2021-04-02 (×2): qty 48, 16d supply, fill #0

## 2021-04-01 MED ORDER — ERYTHROMYCIN 5 MG/GM OP OINT
TOPICAL_OINTMENT | OPHTHALMIC | 0 refills | Status: DC
  Filled 2021-04-01 – 2021-04-02 (×2): qty 3.5, 3d supply, fill #0

## 2021-04-01 MED ORDER — TACROLIMUS 1 MG CAPSULE, IMMEDIATE-RELEASE
ORAL_CAPSULE | ORAL | 11 refills | 30 days | Status: CN
Start: 2021-04-01 — End: 2022-04-01

## 2021-04-01 MED ORDER — ERYTHROMYCIN 5 MG/GRAM (0.5 %) EYE OINTMENT
Freq: Three times a day (TID) | OPHTHALMIC | 0 refills | 3 days | Status: CP
Start: 2021-04-01 — End: 2021-04-04

## 2021-04-02 ENCOUNTER — Other Ambulatory Visit: Payer: Self-pay

## 2021-04-02 ENCOUNTER — Other Ambulatory Visit (HOSPITAL_BASED_OUTPATIENT_CLINIC_OR_DEPARTMENT_OTHER): Payer: Self-pay

## 2021-04-02 ENCOUNTER — Other Ambulatory Visit (HOSPITAL_COMMUNITY): Payer: Self-pay

## 2021-04-03 ENCOUNTER — Ambulatory Visit
Admit: 2021-04-03 | Discharge: 2021-04-03 | Payer: PRIVATE HEALTH INSURANCE | Attending: Nephrology | Primary: Nephrology

## 2021-04-03 DIAGNOSIS — Z940 Kidney transplant status: Secondary | ICD-10-CM | POA: Diagnosis not present

## 2021-04-03 DIAGNOSIS — Z7982 Long term (current) use of aspirin: Secondary | ICD-10-CM | POA: Diagnosis not present

## 2021-04-03 DIAGNOSIS — R0902 Hypoxemia: Secondary | ICD-10-CM | POA: Diagnosis not present

## 2021-04-03 DIAGNOSIS — D849 Immunodeficiency, unspecified: Secondary | ICD-10-CM | POA: Diagnosis not present

## 2021-04-03 DIAGNOSIS — E038 Other specified hypothyroidism: Secondary | ICD-10-CM | POA: Diagnosis not present

## 2021-04-03 DIAGNOSIS — E872 Acidosis: Secondary | ICD-10-CM | POA: Diagnosis not present

## 2021-04-03 DIAGNOSIS — Z1159 Encounter for screening for other viral diseases: Secondary | ICD-10-CM | POA: Diagnosis not present

## 2021-04-03 DIAGNOSIS — Z09 Encounter for follow-up examination after completed treatment for conditions other than malignant neoplasm: Secondary | ICD-10-CM | POA: Diagnosis not present

## 2021-04-03 DIAGNOSIS — R809 Proteinuria, unspecified: Secondary | ICD-10-CM | POA: Diagnosis not present

## 2021-04-03 DIAGNOSIS — Z79899 Other long term (current) drug therapy: Secondary | ICD-10-CM | POA: Diagnosis not present

## 2021-04-03 DIAGNOSIS — D72819 Decreased white blood cell count, unspecified: Secondary | ICD-10-CM | POA: Diagnosis not present

## 2021-04-03 DIAGNOSIS — D649 Anemia, unspecified: Secondary | ICD-10-CM | POA: Diagnosis not present

## 2021-04-03 DIAGNOSIS — G935 Compression of brain: Secondary | ICD-10-CM | POA: Diagnosis not present

## 2021-04-04 ENCOUNTER — Other Ambulatory Visit: Payer: Self-pay | Admitting: *Deleted

## 2021-04-04 NOTE — Patient Outreach (Signed)
[facility]) Care Management  04/04/2021  Joshua Atkinson 08-06-1991 [id number]  Transition of care telephone call  Referral received:03/29/21 Initial outreach:04/04/21 Insurance: [place]   Initial unsuccessful telephone call to patient's preferred number in order to complete transition of care assessment; no answer, left HIPAA compliant voicemail message requesting return call.   Objective: Per the electronic medical record, [name]  was hospitalized at [facility] for Disseminated Vesicular Eruptions , papular rash . Comorbidities include:  Kidney Transplant 2021 , He was discharged to home on 04/01/21 without the need for home health services or durable medical equipment per the discharge summary.   Plan: This RNCM will route unsuccessful outreach letter with [place] Management pamphlet and 24 hour Nurse Advice Line Magnet to [place] [facility] clinical pool to be mailed to patient's home address. This RNCM will attempt another outreach within 4 business days.    Joylene Draft, RN, BSN  [place] Management Coordinator  [phone]- Mobile [phone]- Toll Free Main Office

## 2021-04-05 ENCOUNTER — Ambulatory Visit
Admit: 2021-04-05 | Discharge: 2021-04-06 | Payer: PRIVATE HEALTH INSURANCE | Attending: Infectious Disease | Primary: Infectious Disease

## 2021-04-05 ENCOUNTER — Other Ambulatory Visit (HOSPITAL_COMMUNITY): Payer: Self-pay

## 2021-04-05 ENCOUNTER — Other Ambulatory Visit (HOSPITAL_BASED_OUTPATIENT_CLINIC_OR_DEPARTMENT_OTHER): Payer: Self-pay

## 2021-04-05 DIAGNOSIS — B019 Varicella without complication: Secondary | ICD-10-CM | POA: Diagnosis not present

## 2021-04-05 DIAGNOSIS — B029 Zoster without complications: Secondary | ICD-10-CM | POA: Diagnosis not present

## 2021-04-05 MED ORDER — VALACYCLOVIR HCL 500 MG PO TABS
ORAL_TABLET | ORAL | 2 refills | Status: DC
  Filled 2021-04-05 – 2021-04-17 (×2): qty 90, 90d supply, fill #0
  Filled 2021-08-06: qty 90, 90d supply, fill #1
  Filled 2021-11-12: qty 90, 90d supply, fill #2

## 2021-04-05 MED ORDER — VALACYCLOVIR 500 MG TABLET
ORAL_TABLET | Freq: Every day | ORAL | 2 refills | 90 days | Status: CP
Start: 2021-04-05 — End: ?

## 2021-04-08 DIAGNOSIS — Z940 Kidney transplant status: Principal | ICD-10-CM

## 2021-04-08 DIAGNOSIS — Z79899 Other long term (current) drug therapy: Principal | ICD-10-CM

## 2021-04-08 DIAGNOSIS — Z1159 Encounter for screening for other viral diseases: Principal | ICD-10-CM

## 2021-04-09 ENCOUNTER — Other Ambulatory Visit: Payer: Self-pay | Admitting: *Deleted

## 2021-04-09 NOTE — Patient Outreach (Addendum)
[facility]) Care Management  04/09/2021  Joshua Atkinson 09-27-1991 [id number]   Transition of care call  Referral received: 03/29/21 Initial outreach attempt: 04/04/21 Insurance: [place]    2nd unsuccessful telephone call to patient's preferred contact number in order to complete post hospital discharge transition of care assessment , no answer mailbox is full unable to leave a message.    Objective: Per the electronic medical record, Joshua Atkinson  was hospitalized at [facility] for Disseminated Vesicular Eruptions , papular rash , Varicella zoster Virus . Comorbidities include:  Kidney Transplant 2021 , He was discharged to home on 04/01/21 without the need for home health services or durable medical equipment per the discharge summary.     Plan If no return call from patient will attempt 3rd outreach in the next 4 business days.    Joylene Draft, RN, BSN  [place] Management Coordinator  [phone]- Mobile [phone]- Toll Free Main Office

## 2021-04-12 ENCOUNTER — Encounter: Payer: Self-pay | Admitting: *Deleted

## 2021-04-12 ENCOUNTER — Other Ambulatory Visit: Payer: Self-pay | Admitting: *Deleted

## 2021-04-12 NOTE — Patient Outreach (Addendum)
[facility]) Care Management  04/12/2021  Joshua Atkinson 10-30-1990 [id number]   Transition of care call Referral received: 03/29/21 Initial outreach attempt: 04/04/21 Insurance: [place] unsuccessful telephone call to patient's preferred contact number in order to complete post hospital discharge transition of care assessment; no answer, received message voicemail not setup Placed call to home number able to leave a HIPAA compliant message for return call.   Objective: [name]  was hospitalized at [facility] for Disseminated Vesicular Eruptions , papular rash , Varicella zoster Virus . Comorbidities include:  Kidney Transplant 2021 , He was discharged to home on 04/01/21 without the need for home health services or durable medical equipment per the discharge summary.  Addendum  1400 Subjective: Incoming return call from patient , 2 HIPAA identifiers verified. Explained purpose of call and completed transition of care assessment.  Joshua Atkinson states that he is back to normal, area of rash healed drying up. He discussed attending kidney transplant follow up and  with infection disease with plan to continue valtrex to end of year. He is  tolerating diet, denies bowel or bladder problems.   Reviewed accessing the following [place] Benefits :   He uses a [facility] outpatient pharmacy at [place].       Assessment:  Patient voices good understanding of all discharge instructions.  See transition of care flowsheet for assessment details.   Plan:  Reviewed hospital discharge diagnosis of Varicella zoster virus   and discharge treatment plan using hospital discharge instructions, assessing medication adherence, reviewing problems requiring provider notification, and discussing the importance of follow up with surgeon, primary care provider and/or specialists as directed.  Reviewed [place] healthy lifestyle program  information to receive discounted premium for  2023   Step 1: Get  your annual physical  Step 2: Complete your health assessment  Step 3:Identify your current health status and complete the corresponding action step between October 27, 2020 and June 27, 2021.    No ongoing care management needs identified so will close case to [place] Management services .  Joylene Draft, RN, BSN  [place] Management Coordinator  [phone]- Mobile [phone]- Toll Free Main Office

## 2021-04-15 DIAGNOSIS — N185 Chronic kidney disease, stage 5: Principal | ICD-10-CM

## 2021-04-15 DIAGNOSIS — Z79899 Other long term (current) drug therapy: Principal | ICD-10-CM

## 2021-04-15 DIAGNOSIS — Z1159 Encounter for screening for other viral diseases: Principal | ICD-10-CM

## 2021-04-15 DIAGNOSIS — Z940 Kidney transplant status: Principal | ICD-10-CM

## 2021-04-17 ENCOUNTER — Other Ambulatory Visit (HOSPITAL_COMMUNITY): Payer: Self-pay

## 2021-04-17 ENCOUNTER — Other Ambulatory Visit (HOSPITAL_BASED_OUTPATIENT_CLINIC_OR_DEPARTMENT_OTHER): Payer: Self-pay

## 2021-04-17 MED FILL — Aspirin Tab Delayed Release 81 MG: ORAL | 90 days supply | Qty: 90 | Fill #1 | Status: AC

## 2021-04-17 MED FILL — Tacrolimus Cap 1 MG: ORAL | 30 days supply | Qty: 180 | Fill #2 | Status: AC

## 2021-04-17 MED FILL — Prednisone Tab 5 MG: ORAL | 30 days supply | Qty: 30 | Fill #2 | Status: AC

## 2021-04-17 MED FILL — Mycophenolate Mofetil Cap 250 MG: ORAL | 30 days supply | Qty: 120 | Fill #2 | Status: AC

## 2021-04-17 MED FILL — Levothyroxine Sodium Tab 50 MCG: ORAL | 90 days supply | Qty: 90 | Fill #1 | Status: AC

## 2021-04-18 ENCOUNTER — Other Ambulatory Visit (HOSPITAL_COMMUNITY): Payer: Self-pay

## 2021-04-19 ENCOUNTER — Ambulatory Visit: Admit: 2021-04-19 | Discharge: 2021-04-20 | Payer: PRIVATE HEALTH INSURANCE

## 2021-04-19 DIAGNOSIS — Z79899 Other long term (current) drug therapy: Secondary | ICD-10-CM | POA: Diagnosis not present

## 2021-04-19 DIAGNOSIS — Z1159 Encounter for screening for other viral diseases: Secondary | ICD-10-CM | POA: Diagnosis not present

## 2021-04-19 DIAGNOSIS — Z940 Kidney transplant status: Secondary | ICD-10-CM | POA: Diagnosis not present

## 2021-04-22 DIAGNOSIS — Z1159 Encounter for screening for other viral diseases: Principal | ICD-10-CM

## 2021-04-22 DIAGNOSIS — Z79899 Other long term (current) drug therapy: Principal | ICD-10-CM

## 2021-04-22 DIAGNOSIS — Z940 Kidney transplant status: Principal | ICD-10-CM

## 2021-04-22 MED ORDER — TACROLIMUS 1 MG CAPSULE, IMMEDIATE-RELEASE
ORAL_CAPSULE | Freq: Two times a day (BID) | ORAL | 3 refills | 90 days
Start: 2021-04-22 — End: 2022-04-22

## 2021-04-29 DIAGNOSIS — Z79899 Other long term (current) drug therapy: Principal | ICD-10-CM

## 2021-04-29 DIAGNOSIS — Z940 Kidney transplant status: Principal | ICD-10-CM

## 2021-04-29 DIAGNOSIS — Z1159 Encounter for screening for other viral diseases: Principal | ICD-10-CM

## 2021-05-02 ENCOUNTER — Ambulatory Visit: Payer: Self-pay | Admitting: *Deleted

## 2021-05-03 ENCOUNTER — Ambulatory Visit: Admit: 2021-05-03 | Discharge: 2021-05-04 | Payer: PRIVATE HEALTH INSURANCE

## 2021-05-03 DIAGNOSIS — Z1159 Encounter for screening for other viral diseases: Secondary | ICD-10-CM | POA: Diagnosis not present

## 2021-05-03 DIAGNOSIS — D849 Immunodeficiency, unspecified: Secondary | ICD-10-CM | POA: Diagnosis not present

## 2021-05-03 DIAGNOSIS — Z940 Kidney transplant status: Secondary | ICD-10-CM | POA: Diagnosis not present

## 2021-05-03 DIAGNOSIS — Z79899 Other long term (current) drug therapy: Secondary | ICD-10-CM | POA: Diagnosis not present

## 2021-05-06 DIAGNOSIS — Z79899 Other long term (current) drug therapy: Principal | ICD-10-CM

## 2021-05-06 DIAGNOSIS — Z940 Kidney transplant status: Principal | ICD-10-CM

## 2021-05-06 DIAGNOSIS — Z1159 Encounter for screening for other viral diseases: Principal | ICD-10-CM

## 2021-05-08 ENCOUNTER — Other Ambulatory Visit (HOSPITAL_COMMUNITY): Payer: Self-pay

## 2021-05-08 MED FILL — Prednisone Tab 5 MG: ORAL | 30 days supply | Qty: 30 | Fill #3 | Status: CN

## 2021-05-12 MED ORDER — PREDNISONE 5 MG TABLET
ORAL_TABLET | Freq: Every day | ORAL | 11 refills | 30.00000 days
Start: 2021-05-12 — End: 2022-05-12

## 2021-05-13 ENCOUNTER — Other Ambulatory Visit (HOSPITAL_COMMUNITY): Payer: Self-pay

## 2021-05-13 DIAGNOSIS — E7204 Cystinosis: Principal | ICD-10-CM

## 2021-05-13 DIAGNOSIS — Z1159 Encounter for screening for other viral diseases: Principal | ICD-10-CM

## 2021-05-13 DIAGNOSIS — Z940 Kidney transplant status: Principal | ICD-10-CM

## 2021-05-13 DIAGNOSIS — Z79899 Other long term (current) drug therapy: Principal | ICD-10-CM

## 2021-05-13 DIAGNOSIS — D84821 Immunocompromised state due to drug therapy (CMS-HCC): Principal | ICD-10-CM

## 2021-05-13 DIAGNOSIS — N185 Chronic kidney disease, stage 5: Principal | ICD-10-CM

## 2021-05-13 MED ORDER — TRICITRATES 550-500-334 MG/5ML PO SOLN
ORAL | 11 refills | Status: DC
  Filled 2021-05-13: qty 473, 30d supply, fill #0
  Filled 2021-06-25: qty 473, 30d supply, fill #1
  Filled 2021-08-06: qty 473, 30d supply, fill #2
  Filled 2021-10-11: qty 473, 30d supply, fill #3

## 2021-05-13 MED ORDER — PREDNISONE 5 MG PO TABS
ORAL_TABLET | ORAL | 11 refills | Status: DC
  Filled 2021-05-13: qty 30, 30d supply, fill #0
  Filled 2021-06-07: qty 30, 30d supply, fill #1

## 2021-05-13 MED ORDER — POTAS AND SOD CITRATE-CITRIC ACID 550 MG-500 MG-334 MG/5 ML ORAL SOLN
ORAL | 11 refills | 28 days | Status: CP
Start: 2021-05-13 — End: 2022-05-13

## 2021-05-13 MED ORDER — PREDNISONE 5 MG TABLET
ORAL_TABLET | Freq: Every day | ORAL | 11 refills | 30.00000 days | Status: CP
Start: 2021-05-13 — End: 2022-05-13

## 2021-05-14 ENCOUNTER — Other Ambulatory Visit (HOSPITAL_COMMUNITY): Payer: Self-pay

## 2021-05-15 ENCOUNTER — Ambulatory Visit
Admit: 2021-05-15 | Discharge: 2021-05-15 | Payer: PRIVATE HEALTH INSURANCE | Attending: Nephrology | Primary: Nephrology

## 2021-05-15 ENCOUNTER — Ambulatory Visit: Admit: 2021-05-15 | Discharge: 2021-05-15 | Payer: PRIVATE HEALTH INSURANCE

## 2021-05-15 ENCOUNTER — Other Ambulatory Visit (HOSPITAL_COMMUNITY): Payer: Self-pay

## 2021-05-15 DIAGNOSIS — Z1159 Encounter for screening for other viral diseases: Secondary | ICD-10-CM | POA: Diagnosis not present

## 2021-05-15 DIAGNOSIS — G935 Compression of brain: Secondary | ICD-10-CM | POA: Diagnosis not present

## 2021-05-15 DIAGNOSIS — D84821 Immunocompromised state due to drug therapy (CMS-HCC): Principal | ICD-10-CM

## 2021-05-15 DIAGNOSIS — Z48298 Encounter for aftercare following other organ transplant: Secondary | ICD-10-CM | POA: Diagnosis not present

## 2021-05-15 DIAGNOSIS — Z79890 Hormone replacement therapy (postmenopausal): Secondary | ICD-10-CM | POA: Diagnosis not present

## 2021-05-15 DIAGNOSIS — E7204 Cystinosis: Secondary | ICD-10-CM | POA: Diagnosis not present

## 2021-05-15 DIAGNOSIS — Z940 Kidney transplant status: Secondary | ICD-10-CM | POA: Diagnosis not present

## 2021-05-15 DIAGNOSIS — Z7982 Long term (current) use of aspirin: Secondary | ICD-10-CM | POA: Diagnosis not present

## 2021-05-15 DIAGNOSIS — Z8616 Personal history of COVID-19: Secondary | ICD-10-CM | POA: Diagnosis not present

## 2021-05-15 DIAGNOSIS — D849 Immunodeficiency, unspecified: Secondary | ICD-10-CM | POA: Diagnosis not present

## 2021-05-15 DIAGNOSIS — Z79899 Other long term (current) drug therapy: Secondary | ICD-10-CM | POA: Diagnosis not present

## 2021-05-15 DIAGNOSIS — E039 Hypothyroidism, unspecified: Secondary | ICD-10-CM | POA: Diagnosis not present

## 2021-05-20 DIAGNOSIS — Z79899 Other long term (current) drug therapy: Principal | ICD-10-CM

## 2021-05-20 DIAGNOSIS — Z940 Kidney transplant status: Principal | ICD-10-CM

## 2021-05-20 DIAGNOSIS — Z1159 Encounter for screening for other viral diseases: Principal | ICD-10-CM

## 2021-05-21 DIAGNOSIS — Z940 Kidney transplant status: Principal | ICD-10-CM

## 2021-05-21 MED ORDER — TACROLIMUS 1 MG CAPSULE, IMMEDIATE-RELEASE
ORAL_CAPSULE | Freq: Two times a day (BID) | ORAL | 3 refills | 90 days
Start: 2021-05-21 — End: 2022-05-21

## 2021-05-21 MED ORDER — MYCOPHENOLATE MOFETIL 250 MG CAPSULE
ORAL_CAPSULE | Freq: Two times a day (BID) | ORAL | 11 refills | 30 days | Status: CP
Start: 2021-05-21 — End: 2022-05-21

## 2021-05-22 ENCOUNTER — Other Ambulatory Visit (HOSPITAL_COMMUNITY): Payer: Self-pay

## 2021-05-22 MED ORDER — MYCOPHENOLATE MOFETIL 250 MG PO CAPS
ORAL_CAPSULE | ORAL | 11 refills | Status: DC
  Filled 2021-05-22: qty 120, 30d supply, fill #0
  Filled 2021-06-25: qty 120, 30d supply, fill #1
  Filled 2021-09-24: qty 120, 30d supply, fill #2
  Filled 2021-10-11: qty 120, 30d supply, fill #3
  Filled 2021-11-22: qty 120, 30d supply, fill #4
  Filled 2021-12-25: qty 120, 30d supply, fill #5
  Filled 2022-01-22: qty 120, 30d supply, fill #6
  Filled 2022-02-17: qty 120, 30d supply, fill #7
  Filled 2022-03-11 – 2022-03-14 (×2): qty 120, 30d supply, fill #8
  Filled 2022-04-15: qty 120, 30d supply, fill #9
  Filled 2022-05-19: qty 120, 30d supply, fill #10
  Filled 2022-06-28: qty 120, 30d supply, fill #11

## 2021-05-27 ENCOUNTER — Other Ambulatory Visit (HOSPITAL_COMMUNITY): Payer: Self-pay

## 2021-05-27 DIAGNOSIS — Z940 Kidney transplant status: Principal | ICD-10-CM

## 2021-05-27 DIAGNOSIS — Z1159 Encounter for screening for other viral diseases: Principal | ICD-10-CM

## 2021-05-27 DIAGNOSIS — Z79899 Other long term (current) drug therapy: Principal | ICD-10-CM

## 2021-05-27 MED FILL — Tacrolimus Cap 1 MG: ORAL | 30 days supply | Qty: 180 | Fill #3 | Status: AC

## 2021-06-03 DIAGNOSIS — Z79899 Other long term (current) drug therapy: Principal | ICD-10-CM

## 2021-06-03 DIAGNOSIS — Z940 Kidney transplant status: Principal | ICD-10-CM

## 2021-06-03 DIAGNOSIS — Z1159 Encounter for screening for other viral diseases: Principal | ICD-10-CM

## 2021-06-07 ENCOUNTER — Other Ambulatory Visit (HOSPITAL_COMMUNITY): Payer: Self-pay

## 2021-06-10 DIAGNOSIS — Z940 Kidney transplant status: Principal | ICD-10-CM

## 2021-06-10 DIAGNOSIS — Z1159 Encounter for screening for other viral diseases: Principal | ICD-10-CM

## 2021-06-10 DIAGNOSIS — N185 Chronic kidney disease, stage 5: Principal | ICD-10-CM

## 2021-06-10 DIAGNOSIS — Z79899 Other long term (current) drug therapy: Principal | ICD-10-CM

## 2021-06-17 DIAGNOSIS — Z79899 Other long term (current) drug therapy: Principal | ICD-10-CM

## 2021-06-17 DIAGNOSIS — Z1159 Encounter for screening for other viral diseases: Principal | ICD-10-CM

## 2021-06-17 DIAGNOSIS — Z940 Kidney transplant status: Principal | ICD-10-CM

## 2021-06-24 DIAGNOSIS — D225 Melanocytic nevi of trunk: Secondary | ICD-10-CM | POA: Diagnosis not present

## 2021-06-24 DIAGNOSIS — B078 Other viral warts: Secondary | ICD-10-CM | POA: Diagnosis not present

## 2021-06-24 DIAGNOSIS — B019 Varicella without complication: Secondary | ICD-10-CM | POA: Diagnosis not present

## 2021-06-24 DIAGNOSIS — D2261 Melanocytic nevi of right upper limb, including shoulder: Secondary | ICD-10-CM | POA: Diagnosis not present

## 2021-06-24 DIAGNOSIS — Z1159 Encounter for screening for other viral diseases: Principal | ICD-10-CM

## 2021-06-24 DIAGNOSIS — Z79899 Other long term (current) drug therapy: Principal | ICD-10-CM

## 2021-06-24 DIAGNOSIS — Z940 Kidney transplant status: Principal | ICD-10-CM

## 2021-06-25 ENCOUNTER — Other Ambulatory Visit (HOSPITAL_COMMUNITY): Payer: Self-pay

## 2021-06-25 MED FILL — Tacrolimus Cap 1 MG: ORAL | 30 days supply | Qty: 180 | Fill #4 | Status: AC

## 2021-06-26 ENCOUNTER — Other Ambulatory Visit (HOSPITAL_COMMUNITY): Payer: Self-pay

## 2021-07-01 DIAGNOSIS — Z940 Kidney transplant status: Principal | ICD-10-CM

## 2021-07-01 DIAGNOSIS — Z1159 Encounter for screening for other viral diseases: Principal | ICD-10-CM

## 2021-07-01 DIAGNOSIS — Z79899 Other long term (current) drug therapy: Principal | ICD-10-CM

## 2021-07-07 MED ORDER — PREDNISONE 5 MG TABLET
ORAL_TABLET | Freq: Every day | ORAL | 11 refills | 30 days
Start: 2021-07-07 — End: 2022-07-07

## 2021-07-08 ENCOUNTER — Other Ambulatory Visit (HOSPITAL_COMMUNITY): Payer: Self-pay

## 2021-07-08 DIAGNOSIS — Z940 Kidney transplant status: Principal | ICD-10-CM

## 2021-07-08 DIAGNOSIS — N185 Chronic kidney disease, stage 5: Principal | ICD-10-CM

## 2021-07-08 DIAGNOSIS — Z79899 Other long term (current) drug therapy: Principal | ICD-10-CM

## 2021-07-08 DIAGNOSIS — Z1159 Encounter for screening for other viral diseases: Principal | ICD-10-CM

## 2021-07-08 MED ORDER — PREDNISONE 5 MG PO TABS
5.0000 mg | ORAL_TABLET | Freq: Every day | ORAL | 11 refills | Status: DC
  Filled 2021-07-08: qty 30, 30d supply, fill #0
  Filled 2021-08-06: qty 30, 30d supply, fill #1
  Filled 2021-09-11: qty 30, 30d supply, fill #2
  Filled 2021-10-11: qty 30, 30d supply, fill #3
  Filled 2021-11-12: qty 30, 30d supply, fill #4
  Filled 2021-12-06: qty 30, 30d supply, fill #5
  Filled 2022-01-07: qty 30, 30d supply, fill #6
  Filled 2022-02-05: qty 30, 30d supply, fill #7
  Filled 2022-03-11: qty 30, 30d supply, fill #8
  Filled 2022-04-02 – 2022-04-04 (×2): qty 30, 30d supply, fill #9
  Filled 2022-05-13: qty 30, 30d supply, fill #10
  Filled 2022-06-09: qty 30, 30d supply, fill #11

## 2021-07-08 MED ORDER — PREDNISONE 5 MG TABLET
ORAL_TABLET | Freq: Every day | ORAL | 11 refills | 30 days | Status: CP
Start: 2021-07-08 — End: 2022-07-08

## 2021-07-09 DIAGNOSIS — Z940 Kidney transplant status: Principal | ICD-10-CM

## 2021-07-10 ENCOUNTER — Ambulatory Visit: Admit: 2021-07-10 | Discharge: 2021-07-11 | Payer: PRIVATE HEALTH INSURANCE

## 2021-07-10 DIAGNOSIS — Z940 Kidney transplant status: Secondary | ICD-10-CM | POA: Diagnosis not present

## 2021-07-10 DIAGNOSIS — Z1159 Encounter for screening for other viral diseases: Secondary | ICD-10-CM | POA: Diagnosis not present

## 2021-07-10 DIAGNOSIS — Z79899 Other long term (current) drug therapy: Secondary | ICD-10-CM | POA: Diagnosis not present

## 2021-07-15 DIAGNOSIS — Z940 Kidney transplant status: Principal | ICD-10-CM

## 2021-07-15 DIAGNOSIS — Z1159 Encounter for screening for other viral diseases: Principal | ICD-10-CM

## 2021-07-15 DIAGNOSIS — Z79899 Other long term (current) drug therapy: Principal | ICD-10-CM

## 2021-07-17 ENCOUNTER — Ambulatory Visit: Admit: 2021-07-17 | Discharge: 2021-07-18 | Payer: PRIVATE HEALTH INSURANCE

## 2021-07-17 ENCOUNTER — Encounter
Admit: 2021-07-17 | Discharge: 2021-07-17 | Payer: PRIVATE HEALTH INSURANCE | Attending: Nephrology | Primary: Nephrology

## 2021-07-17 ENCOUNTER — Ambulatory Visit: Admit: 2021-07-17 | Discharge: 2021-07-17 | Payer: PRIVATE HEALTH INSURANCE

## 2021-07-17 ENCOUNTER — Ambulatory Visit
Admit: 2021-07-17 | Discharge: 2021-07-17 | Payer: PRIVATE HEALTH INSURANCE | Attending: Nephrology | Primary: Nephrology

## 2021-07-17 DIAGNOSIS — Z79899 Other long term (current) drug therapy: Secondary | ICD-10-CM | POA: Diagnosis not present

## 2021-07-17 DIAGNOSIS — Z7952 Long term (current) use of systemic steroids: Secondary | ICD-10-CM | POA: Diagnosis not present

## 2021-07-17 DIAGNOSIS — D849 Immunodeficiency, unspecified: Secondary | ICD-10-CM | POA: Diagnosis not present

## 2021-07-17 DIAGNOSIS — Z1159 Encounter for screening for other viral diseases: Secondary | ICD-10-CM | POA: Diagnosis not present

## 2021-07-17 DIAGNOSIS — D84821 Immunocompromised state due to drug therapy (CMS-HCC): Principal | ICD-10-CM

## 2021-07-17 DIAGNOSIS — Z8619 Personal history of other infectious and parasitic diseases: Secondary | ICD-10-CM | POA: Diagnosis not present

## 2021-07-17 DIAGNOSIS — E039 Hypothyroidism, unspecified: Secondary | ICD-10-CM | POA: Diagnosis not present

## 2021-07-17 DIAGNOSIS — Z940 Kidney transplant status: Secondary | ICD-10-CM | POA: Diagnosis not present

## 2021-07-17 DIAGNOSIS — Z7982 Long term (current) use of aspirin: Secondary | ICD-10-CM | POA: Diagnosis not present

## 2021-07-17 DIAGNOSIS — Z48298 Encounter for aftercare following other organ transplant: Secondary | ICD-10-CM | POA: Diagnosis not present

## 2021-07-17 DIAGNOSIS — D649 Anemia, unspecified: Secondary | ICD-10-CM | POA: Diagnosis not present

## 2021-07-17 DIAGNOSIS — Z8616 Personal history of COVID-19: Secondary | ICD-10-CM | POA: Diagnosis not present

## 2021-07-17 DIAGNOSIS — Z09 Encounter for follow-up examination after completed treatment for conditions other than malignant neoplasm: Secondary | ICD-10-CM | POA: Diagnosis not present

## 2021-07-17 DIAGNOSIS — E872 Acidosis: Secondary | ICD-10-CM | POA: Diagnosis not present

## 2021-07-17 DIAGNOSIS — Z23 Encounter for immunization: Secondary | ICD-10-CM | POA: Diagnosis not present

## 2021-07-17 DIAGNOSIS — B078 Other viral warts: Secondary | ICD-10-CM | POA: Diagnosis not present

## 2021-07-17 DIAGNOSIS — G935 Compression of brain: Secondary | ICD-10-CM | POA: Diagnosis not present

## 2021-07-17 DIAGNOSIS — R519 Headache, unspecified: Secondary | ICD-10-CM | POA: Diagnosis not present

## 2021-07-22 DIAGNOSIS — Z940 Kidney transplant status: Principal | ICD-10-CM

## 2021-07-22 DIAGNOSIS — Z1159 Encounter for screening for other viral diseases: Principal | ICD-10-CM

## 2021-07-22 DIAGNOSIS — Z79899 Other long term (current) drug therapy: Principal | ICD-10-CM

## 2021-07-22 MED ORDER — MYCOPHENOLATE MOFETIL 250 MG CAPSULE
ORAL_CAPSULE | Freq: Two times a day (BID) | ORAL | 11 refills | 30 days
Start: 2021-07-22 — End: 2022-07-22

## 2021-07-23 ENCOUNTER — Other Ambulatory Visit (HOSPITAL_COMMUNITY): Payer: Self-pay

## 2021-07-23 MED ORDER — MYCOPHENOLATE MOFETIL 250 MG PO CAPS
ORAL_CAPSULE | ORAL | 11 refills | Status: DC
  Filled 2021-07-23: qty 120, 30d supply, fill #0
  Filled 2021-08-21: qty 120, 30d supply, fill #1

## 2021-07-23 MED ORDER — MYCOPHENOLATE MOFETIL 250 MG CAPSULE
ORAL_CAPSULE | Freq: Two times a day (BID) | ORAL | 11 refills | 30.00000 days | Status: CP
Start: 2021-07-23 — End: 2022-07-23

## 2021-07-25 DIAGNOSIS — B078 Other viral warts: Secondary | ICD-10-CM | POA: Diagnosis not present

## 2021-07-26 ENCOUNTER — Other Ambulatory Visit (HOSPITAL_COMMUNITY): Payer: Self-pay

## 2021-07-26 MED FILL — Tacrolimus Cap 1 MG: ORAL | 30 days supply | Qty: 180 | Fill #5 | Status: AC

## 2021-07-29 DIAGNOSIS — Z940 Kidney transplant status: Principal | ICD-10-CM

## 2021-07-29 DIAGNOSIS — Z79899 Other long term (current) drug therapy: Principal | ICD-10-CM

## 2021-07-29 DIAGNOSIS — Z1159 Encounter for screening for other viral diseases: Principal | ICD-10-CM

## 2021-08-05 DIAGNOSIS — Z79899 Other long term (current) drug therapy: Principal | ICD-10-CM

## 2021-08-05 DIAGNOSIS — Z940 Kidney transplant status: Principal | ICD-10-CM

## 2021-08-05 DIAGNOSIS — N185 Chronic kidney disease, stage 5: Principal | ICD-10-CM

## 2021-08-05 DIAGNOSIS — Z1159 Encounter for screening for other viral diseases: Principal | ICD-10-CM

## 2021-08-06 ENCOUNTER — Other Ambulatory Visit (HOSPITAL_COMMUNITY): Payer: Self-pay

## 2021-08-06 ENCOUNTER — Telehealth: Payer: Self-pay | Admitting: Internal Medicine

## 2021-08-06 MED FILL — Aspirin Tab Delayed Release 81 MG: ORAL | 90 days supply | Qty: 90 | Fill #2 | Status: AC

## 2021-08-06 MED FILL — Levothyroxine Sodium Tab 50 MCG: ORAL | 90 days supply | Qty: 90 | Fill #2 | Status: AC

## 2021-08-06 NOTE — Telephone Encounter (Signed)
Patient calling in as his kidney and dermatologist providers would like for him to get the HPV series.   Please advise, okay to schedule nurse visit?

## 2021-08-06 NOTE — Telephone Encounter (Signed)
I am ok with him getting HPV vaccine series.  Please confirm he has not had HPV vaccines.

## 2021-08-06 NOTE — Telephone Encounter (Signed)
Are you ok with him getting these vaccines if ok with his kidney specialist?

## 2021-08-07 ENCOUNTER — Other Ambulatory Visit (HOSPITAL_COMMUNITY): Payer: Self-pay

## 2021-08-07 NOTE — Telephone Encounter (Signed)
Confirmed has not had HPV vaccine. Scheduled for first vaccine and advised that he needs another in 1-2 months and then 6 months from 1st dose.

## 2021-08-08 ENCOUNTER — Ambulatory Visit (INDEPENDENT_AMBULATORY_CARE_PROVIDER_SITE_OTHER): Payer: 59

## 2021-08-08 ENCOUNTER — Other Ambulatory Visit: Payer: Self-pay

## 2021-08-08 DIAGNOSIS — Z23 Encounter for immunization: Secondary | ICD-10-CM

## 2021-08-08 NOTE — Progress Notes (Signed)
Patient presented for the first HPV Vaccine to right deltoid. Pt voiced no concerns or discomfort during time of injection. Please see date of order from Dr. Nicki Reaper on the 08/06/21 telephone encounter.

## 2021-08-12 DIAGNOSIS — Z79899 Other long term (current) drug therapy: Principal | ICD-10-CM

## 2021-08-12 DIAGNOSIS — Z940 Kidney transplant status: Principal | ICD-10-CM

## 2021-08-12 DIAGNOSIS — Z1159 Encounter for screening for other viral diseases: Principal | ICD-10-CM

## 2021-08-19 DIAGNOSIS — Z940 Kidney transplant status: Principal | ICD-10-CM

## 2021-08-19 DIAGNOSIS — Z79899 Other long term (current) drug therapy: Principal | ICD-10-CM

## 2021-08-19 DIAGNOSIS — Z1159 Encounter for screening for other viral diseases: Principal | ICD-10-CM

## 2021-08-21 ENCOUNTER — Other Ambulatory Visit (HOSPITAL_COMMUNITY): Payer: Self-pay

## 2021-08-23 ENCOUNTER — Other Ambulatory Visit (HOSPITAL_COMMUNITY): Payer: Self-pay

## 2021-08-26 DIAGNOSIS — Z940 Kidney transplant status: Principal | ICD-10-CM

## 2021-08-26 DIAGNOSIS — Z1159 Encounter for screening for other viral diseases: Principal | ICD-10-CM

## 2021-08-26 DIAGNOSIS — Z79899 Other long term (current) drug therapy: Principal | ICD-10-CM

## 2021-08-28 DIAGNOSIS — B070 Plantar wart: Secondary | ICD-10-CM | POA: Diagnosis not present

## 2021-08-28 DIAGNOSIS — B078 Other viral warts: Secondary | ICD-10-CM | POA: Diagnosis not present

## 2021-09-02 DIAGNOSIS — Z1159 Encounter for screening for other viral diseases: Principal | ICD-10-CM

## 2021-09-02 DIAGNOSIS — Z79899 Other long term (current) drug therapy: Principal | ICD-10-CM

## 2021-09-02 DIAGNOSIS — Z940 Kidney transplant status: Principal | ICD-10-CM

## 2021-09-04 ENCOUNTER — Other Ambulatory Visit (HOSPITAL_COMMUNITY): Payer: Self-pay

## 2021-09-04 MED FILL — Tacrolimus Cap 1 MG: ORAL | 30 days supply | Qty: 180 | Fill #6 | Status: AC

## 2021-09-09 DIAGNOSIS — Z940 Kidney transplant status: Principal | ICD-10-CM

## 2021-09-09 DIAGNOSIS — Z79899 Other long term (current) drug therapy: Principal | ICD-10-CM

## 2021-09-09 DIAGNOSIS — Z1159 Encounter for screening for other viral diseases: Principal | ICD-10-CM

## 2021-09-11 ENCOUNTER — Other Ambulatory Visit (HOSPITAL_COMMUNITY): Payer: Self-pay

## 2021-09-12 ENCOUNTER — Other Ambulatory Visit: Payer: Self-pay | Admitting: *Deleted

## 2021-09-12 ENCOUNTER — Encounter: Payer: Self-pay | Admitting: *Deleted

## 2021-09-16 ENCOUNTER — Ambulatory Visit: Payer: 59 | Admitting: Psychiatry

## 2021-09-16 ENCOUNTER — Other Ambulatory Visit (HOSPITAL_COMMUNITY): Payer: Self-pay

## 2021-09-16 DIAGNOSIS — Z940 Kidney transplant status: Principal | ICD-10-CM

## 2021-09-16 DIAGNOSIS — Z79899 Other long term (current) drug therapy: Principal | ICD-10-CM

## 2021-09-16 DIAGNOSIS — Z1159 Encounter for screening for other viral diseases: Principal | ICD-10-CM
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
## 2021-09-23 DIAGNOSIS — Z940 Kidney transplant status: Principal | ICD-10-CM

## 2021-09-23 DIAGNOSIS — Z1159 Encounter for screening for other viral diseases: Principal | ICD-10-CM

## 2021-09-23 DIAGNOSIS — Z79899 Other long term (current) drug therapy: Principal | ICD-10-CM

## 2021-09-24 ENCOUNTER — Other Ambulatory Visit (HOSPITAL_COMMUNITY): Payer: Self-pay

## 2021-09-24 MED FILL — Tacrolimus Cap 1 MG: ORAL | 30 days supply | Qty: 180 | Fill #7 | Status: AC

## 2021-09-25 ENCOUNTER — Other Ambulatory Visit (HOSPITAL_COMMUNITY): Payer: Self-pay

## 2021-09-25 DIAGNOSIS — B078 Other viral warts: Secondary | ICD-10-CM | POA: Diagnosis not present

## 2021-09-25 MED ORDER — FLUOROURACIL 5 % EX CREA
TOPICAL_CREAM | CUTANEOUS | 0 refills | Status: DC
  Filled 2021-09-25: qty 40, 30d supply, fill #0

## 2021-09-26 ENCOUNTER — Other Ambulatory Visit (HOSPITAL_COMMUNITY): Payer: Self-pay

## 2021-09-30 DIAGNOSIS — Z0289 Encounter for other administrative examinations: Secondary | ICD-10-CM

## 2021-10-03 ENCOUNTER — Other Ambulatory Visit (HOSPITAL_COMMUNITY): Payer: Self-pay

## 2021-10-06 DIAGNOSIS — Z940 Kidney transplant status: Principal | ICD-10-CM

## 2021-10-06 DIAGNOSIS — Z1159 Encounter for screening for other viral diseases: Principal | ICD-10-CM

## 2021-10-06 DIAGNOSIS — Z79899 Other long term (current) drug therapy: Principal | ICD-10-CM

## 2021-10-09 ENCOUNTER — Ambulatory Visit (INDEPENDENT_AMBULATORY_CARE_PROVIDER_SITE_OTHER): Payer: 59 | Admitting: *Deleted

## 2021-10-09 ENCOUNTER — Other Ambulatory Visit: Payer: Self-pay

## 2021-10-09 ENCOUNTER — Ambulatory Visit: Admit: 2021-10-09 | Payer: PRIVATE HEALTH INSURANCE | Attending: Physician | Primary: Physician

## 2021-10-09 DIAGNOSIS — E7204 Cystinosis: Secondary | ICD-10-CM | POA: Diagnosis not present

## 2021-10-09 DIAGNOSIS — Z23 Encounter for immunization: Secondary | ICD-10-CM

## 2021-10-09 NOTE — Patient Instructions (Signed)
Get a semen analysis with [facility] ([email or web address])  Get scrotal ultrasound  Get blood work done before 10am  Follow up via video visit to review labs, scrotal ultrasound, and semen analysis

## 2021-10-09 NOTE — Progress Notes (Signed)
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   PMD: Dr. Brent General Per Patient Provider  CC: infertility    HPI: Unprotected intercourse for 13 month.  No pregnancy with his current partner.  No pregnancy with previous partners. Attempts to achieve pregnancy have included: no birth control.  No siblings with fertility problems, parents without fertility problems. Married x 1.5 years.    Diagnosed with cystinosis when age 32. Kidney transplant from father; 2nd more recently    Stress is a minimal problem, no toxic, heat, industrial exposures currently. 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 No cancer/chemotherapy. No fertility toxic medications.     Normal libido, erections are strong, intercourse regularly every week, no lubricants, no treatments for erectile problems.      Partner Fertility History:  Lurena Joiner is 59 G0 with regular periods every 28 days. She has been diagnosed with: no known fertility problems. 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    Physical Exam:  There is no height or weight on file to calculate BMI. There were no vitals filed for this visit.  Constitution:  [facility], NAD      Labs:  None relevant    A: 30 y.o. with desire for fertility and cystinosis. Enrolled in Cystinosis study. Has been trying to conceive for 13 months. Will obtain endocrine labs and semen analysis to evaluate current fertility potential. Discussed fertility treatment options including TI, IUI, and IVF pending test results.    P:  1. SA Fellow  2. Fsh, lh, prl, T, inhibin-B, E2, 17 OHP  3. Scrotal US  4. Email Swa, Jim Cystinosis study  5. TH 6-8 weeks        I  performed this evaluation using real-time telehealth tools, including a live video Zoom connection between my location and the patient's location. Prior to initiating, the patient consented to perform this evaluation using telehealth tools.

## 2021-10-09 NOTE — Progress Notes (Signed)
Patient presented for HPV vaccine injection to left deltoid, patient voiced no concerns nor showed any signs of distress during injection

## 2021-10-11 ENCOUNTER — Other Ambulatory Visit (HOSPITAL_COMMUNITY): Payer: Self-pay

## 2021-10-11 MED ORDER — MG-PLUS-PROTEIN 133 MG TABLET
ORAL_TABLET | 4 refills | 0 days
Start: 2021-10-11 — End: ?

## 2021-10-11 MED FILL — Tacrolimus Cap 1 MG: ORAL | 30 days supply | Qty: 180 | Fill #8 | Status: AC

## 2021-10-13 MED ORDER — MG-PLUS-PROTEIN 133 MG TABLET
ORAL_TABLET | Freq: Two times a day (BID) | ORAL | 11 refills | 30.00000 days | Status: CP
Start: 2021-10-13 — End: 2022-10-13

## 2021-10-14 ENCOUNTER — Other Ambulatory Visit (HOSPITAL_COMMUNITY): Payer: Self-pay

## 2021-10-14 DIAGNOSIS — Z79899 Other long term (current) drug therapy: Principal | ICD-10-CM

## 2021-10-14 DIAGNOSIS — Z1159 Encounter for screening for other viral diseases: Principal | ICD-10-CM

## 2021-10-14 DIAGNOSIS — Z940 Kidney transplant status: Principal | ICD-10-CM

## 2021-10-14 MED ORDER — MG PLUS PROTEIN 133 MG PO TABS
ORAL_TABLET | ORAL | 11 refills | Status: AC
  Filled 2021-10-14: qty 100, 50d supply, fill #0
  Filled 2021-12-06: qty 100, 50d supply, fill #1
  Filled 2022-01-22: qty 100, 50d supply, fill #2

## 2021-10-15 ENCOUNTER — Ambulatory Visit: Admit: 2021-10-15 | Discharge: 2021-10-16 | Payer: PRIVATE HEALTH INSURANCE

## 2021-10-15 ENCOUNTER — Other Ambulatory Visit (HOSPITAL_COMMUNITY): Payer: Self-pay

## 2021-10-15 DIAGNOSIS — Z940 Kidney transplant status: Secondary | ICD-10-CM | POA: Diagnosis not present

## 2021-10-15 DIAGNOSIS — Z79899 Other long term (current) drug therapy: Secondary | ICD-10-CM | POA: Diagnosis not present

## 2021-10-15 DIAGNOSIS — Z4822 Encounter for aftercare following kidney transplant: Secondary | ICD-10-CM | POA: Diagnosis not present

## 2021-10-15 DIAGNOSIS — Z1159 Encounter for screening for other viral diseases: Secondary | ICD-10-CM | POA: Diagnosis not present

## 2021-10-16 ENCOUNTER — Ambulatory Visit
Admit: 2021-10-16 | Discharge: 2021-10-16 | Payer: PRIVATE HEALTH INSURANCE | Attending: Nephrology | Primary: Nephrology

## 2021-10-16 ENCOUNTER — Ambulatory Visit: Admit: 2021-10-16 | Discharge: 2021-10-16 | Payer: PRIVATE HEALTH INSURANCE

## 2021-10-16 ENCOUNTER — Institutional Professional Consult (permissible substitution): Admit: 2021-10-16 | Discharge: 2021-10-16 | Payer: PRIVATE HEALTH INSURANCE

## 2021-10-16 ENCOUNTER — Other Ambulatory Visit (HOSPITAL_COMMUNITY): Payer: Self-pay

## 2021-10-16 DIAGNOSIS — Z48298 Encounter for aftercare following other organ transplant: Secondary | ICD-10-CM | POA: Diagnosis not present

## 2021-10-16 DIAGNOSIS — E7204 Cystinosis: Secondary | ICD-10-CM | POA: Diagnosis not present

## 2021-10-16 DIAGNOSIS — E039 Hypothyroidism, unspecified: Secondary | ICD-10-CM | POA: Diagnosis not present

## 2021-10-16 DIAGNOSIS — D849 Immunodeficiency, unspecified: Secondary | ICD-10-CM | POA: Diagnosis not present

## 2021-10-16 DIAGNOSIS — E872 Acidosis, unspecified: Secondary | ICD-10-CM | POA: Diagnosis not present

## 2021-10-16 DIAGNOSIS — Z940 Kidney transplant status: Secondary | ICD-10-CM | POA: Diagnosis not present

## 2021-10-16 DIAGNOSIS — Z23 Encounter for immunization: Secondary | ICD-10-CM | POA: Diagnosis not present

## 2021-10-16 DIAGNOSIS — I10 Essential (primary) hypertension: Secondary | ICD-10-CM | POA: Diagnosis not present

## 2021-10-16 DIAGNOSIS — B079 Viral wart, unspecified: Secondary | ICD-10-CM | POA: Diagnosis not present

## 2021-10-16 DIAGNOSIS — Z8619 Personal history of other infectious and parasitic diseases: Secondary | ICD-10-CM | POA: Diagnosis not present

## 2021-10-16 DIAGNOSIS — G935 Compression of brain: Secondary | ICD-10-CM | POA: Diagnosis not present

## 2021-10-16 DIAGNOSIS — R809 Proteinuria, unspecified: Secondary | ICD-10-CM | POA: Diagnosis not present

## 2021-10-16 DIAGNOSIS — D72819 Decreased white blood cell count, unspecified: Secondary | ICD-10-CM | POA: Diagnosis not present

## 2021-10-17 DIAGNOSIS — E7204 Cystinosis: Principal | ICD-10-CM

## 2021-10-17 MED ORDER — CYSTAGON 150 MG CAPSULE
ORAL_CAPSULE | ORAL | 7 refills | 0.00000 days | Status: CP
Start: 2021-10-17 — End: ?

## 2021-10-29 DIAGNOSIS — B078 Other viral warts: Secondary | ICD-10-CM | POA: Diagnosis not present

## 2021-10-29 DIAGNOSIS — B070 Plantar wart: Secondary | ICD-10-CM | POA: Diagnosis not present
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
## 2021-10-31 ENCOUNTER — Encounter: Payer: Self-pay | Admitting: Psychiatry

## 2021-10-31 ENCOUNTER — Ambulatory Visit (INDEPENDENT_AMBULATORY_CARE_PROVIDER_SITE_OTHER): Payer: 59 | Admitting: Psychiatry

## 2021-10-31 ENCOUNTER — Other Ambulatory Visit (HOSPITAL_COMMUNITY): Payer: Self-pay

## 2021-10-31 ENCOUNTER — Other Ambulatory Visit: Payer: Self-pay

## 2021-10-31 VITALS — BP 123/84 | HR 79 | Ht 68.0 in | Wt 141.0 lb

## 2021-10-31 DIAGNOSIS — G935 Compression of brain: Secondary | ICD-10-CM

## 2021-10-31 DIAGNOSIS — G43009 Migraine without aura, not intractable, without status migrainosus: Secondary | ICD-10-CM

## 2021-10-31 MED ORDER — RIZATRIPTAN BENZOATE 10 MG PO TABS
10.0000 mg | ORAL_TABLET | ORAL | 2 refills | Status: DC | PRN
  Filled 2021-10-31: qty 10, 30d supply, fill #0
  Filled 2022-02-03: qty 10, 30d supply, fill #1
  Filled 2022-04-23: qty 10, 30d supply, fill #2

## 2021-10-31 NOTE — Patient Instructions (Signed)
Start Maxalt as needed for migraines. Take at the onset of migraine. If headache recurs or does not fully resolve, you may take a second dose after 2 hours. Please avoid taking more than 2 days per week. MRI of spine

## 2021-11-11 DIAGNOSIS — Z79899 Other long term (current) drug therapy: Principal | ICD-10-CM

## 2021-11-11 DIAGNOSIS — Z940 Kidney transplant status: Principal | ICD-10-CM

## 2021-11-11 DIAGNOSIS — Z1159 Encounter for screening for other viral diseases: Principal | ICD-10-CM

## 2021-11-12 ENCOUNTER — Telehealth: Payer: Self-pay | Admitting: Psychiatry

## 2021-11-12 ENCOUNTER — Other Ambulatory Visit (HOSPITAL_COMMUNITY): Payer: Self-pay

## 2021-11-12 NOTE — Telephone Encounter (Signed)
MR Cervical spine w/wo contrast, MR Lumbar spine w/wo contrast & MR Thoracic spine w/wo contrast Dr. Georgia Dom UMR auth: [place] ref # [id number].  Patient is scheduled at [facility] for 11/19/21

## 2021-11-18 NOTE — Telephone Encounter (Signed)
Patient called and r/s for 11/27/21.

## 2021-11-19 ENCOUNTER — Other Ambulatory Visit: Payer: 59

## 2021-11-22 ENCOUNTER — Other Ambulatory Visit (HOSPITAL_COMMUNITY): Payer: Self-pay

## 2021-11-22 MED FILL — Pot & Sod Citrates w/ Cit Ac Soln 550-500-334 MG/5ML: ORAL | 28 days supply | Qty: 480 | Fill #0 | Status: AC

## 2021-11-25 ENCOUNTER — Other Ambulatory Visit (HOSPITAL_COMMUNITY): Payer: Self-pay

## 2021-11-27 ENCOUNTER — Other Ambulatory Visit: Payer: Self-pay | Admitting: Psychiatry

## 2021-11-27 ENCOUNTER — Ambulatory Visit: Payer: 59

## 2021-11-27 DIAGNOSIS — G935 Compression of brain: Secondary | ICD-10-CM

## 2021-12-03 ENCOUNTER — Other Ambulatory Visit (HOSPITAL_COMMUNITY): Payer: Self-pay

## 2021-12-03 DIAGNOSIS — Z940 Kidney transplant status: Principal | ICD-10-CM

## 2021-12-03 MED ORDER — LEVOTHYROXINE SODIUM 50 MCG PO TABS
ORAL_TABLET | ORAL | 11 refills | Status: DC
  Filled 2021-12-03: qty 30, 30d supply, fill #0
  Filled 2022-01-07: qty 30, 30d supply, fill #1
  Filled 2022-02-05: qty 30, 30d supply, fill #2
  Filled 2022-03-11: qty 30, 30d supply, fill #3
  Filled 2022-04-02 – 2022-04-04 (×2): qty 30, 30d supply, fill #4
  Filled 2022-05-13: qty 30, 30d supply, fill #5
  Filled 2022-06-09: qty 90, 90d supply, fill #6
  Filled 2022-09-16: qty 90, 90d supply, fill #7

## 2021-12-03 MED ORDER — MG-PLUS PROTEIN 133 MG PO TABS
ORAL_TABLET | ORAL | 11 refills | Status: AC
Start: 2021-12-03 — End: ?
  Filled 2021-12-03: qty 60, 30d supply, fill #0

## 2021-12-03 MED ORDER — MG-PLUS-PROTEIN 133 MG TABLET
ORAL_TABLET | Freq: Two times a day (BID) | ORAL | 11 refills | 30.00000 days | Status: CP
Start: 2021-12-03 — End: 2022-12-03

## 2021-12-03 MED ORDER — LEVOTHYROXINE 50 MCG TABLET
ORAL_TABLET | Freq: Every evening | ORAL | 11 refills | 30 days | Status: CP
Start: 2021-12-03 — End: 2022-12-03

## 2021-12-06 ENCOUNTER — Other Ambulatory Visit (HOSPITAL_COMMUNITY): Payer: Self-pay

## 2021-12-06 MED FILL — Tacrolimus Cap 1 MG: ORAL | 30 days supply | Qty: 180 | Fill #9 | Status: AC

## 2021-12-09 ENCOUNTER — Other Ambulatory Visit (HOSPITAL_COMMUNITY): Payer: Self-pay

## 2021-12-09 DIAGNOSIS — Z940 Kidney transplant status: Principal | ICD-10-CM

## 2021-12-09 DIAGNOSIS — Z79899 Other long term (current) drug therapy: Principal | ICD-10-CM

## 2021-12-09 DIAGNOSIS — Z1159 Encounter for screening for other viral diseases: Principal | ICD-10-CM

## 2021-12-17 DIAGNOSIS — Z1159 Encounter for screening for other viral diseases: Principal | ICD-10-CM

## 2021-12-17 DIAGNOSIS — Z940 Kidney transplant status: Principal | ICD-10-CM

## 2021-12-17 DIAGNOSIS — Z79899 Other long term (current) drug therapy: Principal | ICD-10-CM

## 2021-12-25 ENCOUNTER — Other Ambulatory Visit (HOSPITAL_COMMUNITY): Payer: Self-pay

## 2022-01-02 DIAGNOSIS — Z940 Kidney transplant status: Principal | ICD-10-CM

## 2022-01-02 DIAGNOSIS — D631 Anemia in chronic kidney disease: Principal | ICD-10-CM

## 2022-01-02 DIAGNOSIS — Z1159 Encounter for screening for other viral diseases: Principal | ICD-10-CM

## 2022-01-02 DIAGNOSIS — Z79899 Other long term (current) drug therapy: Principal | ICD-10-CM

## 2022-01-02 DIAGNOSIS — N189 Chronic kidney disease, unspecified: Principal | ICD-10-CM

## 2022-01-06 DIAGNOSIS — Z940 Kidney transplant status: Principal | ICD-10-CM

## 2022-01-06 DIAGNOSIS — Z1159 Encounter for screening for other viral diseases: Principal | ICD-10-CM

## 2022-01-06 DIAGNOSIS — Z79899 Other long term (current) drug therapy: Principal | ICD-10-CM

## 2022-01-07 ENCOUNTER — Other Ambulatory Visit (HOSPITAL_COMMUNITY): Payer: Self-pay

## 2022-01-07 ENCOUNTER — Other Ambulatory Visit: Payer: Self-pay

## 2022-01-08 ENCOUNTER — Other Ambulatory Visit (HOSPITAL_COMMUNITY): Payer: Self-pay

## 2022-01-09 ENCOUNTER — Ambulatory Visit: Admit: 2022-01-09 | Discharge: 2022-01-10 | Payer: PRIVATE HEALTH INSURANCE

## 2022-01-09 DIAGNOSIS — Z940 Kidney transplant status: Secondary | ICD-10-CM | POA: Diagnosis not present

## 2022-01-09 DIAGNOSIS — Z8619 Personal history of other infectious and parasitic diseases: Secondary | ICD-10-CM | POA: Diagnosis not present

## 2022-01-09 DIAGNOSIS — B078 Other viral warts: Secondary | ICD-10-CM | POA: Diagnosis not present

## 2022-01-09 DIAGNOSIS — Z79899 Other long term (current) drug therapy: Secondary | ICD-10-CM | POA: Diagnosis not present

## 2022-01-09 DIAGNOSIS — D84821 Immunocompromised state due to drug therapy (CMS-HCC): Principal | ICD-10-CM

## 2022-01-15 ENCOUNTER — Ambulatory Visit
Admit: 2022-01-15 | Discharge: 2022-01-16 | Payer: PRIVATE HEALTH INSURANCE | Attending: Nephrology | Primary: Nephrology

## 2022-01-15 ENCOUNTER — Ambulatory Visit: Admit: 2022-01-15 | Discharge: 2022-01-15 | Payer: PRIVATE HEALTH INSURANCE

## 2022-01-15 ENCOUNTER — Other Ambulatory Visit (HOSPITAL_COMMUNITY): Payer: Self-pay

## 2022-01-15 DIAGNOSIS — R809 Proteinuria, unspecified: Secondary | ICD-10-CM | POA: Diagnosis not present

## 2022-01-15 DIAGNOSIS — E7204 Cystinosis: Secondary | ICD-10-CM | POA: Diagnosis not present

## 2022-01-15 DIAGNOSIS — Z79899 Other long term (current) drug therapy: Secondary | ICD-10-CM | POA: Diagnosis not present

## 2022-01-15 DIAGNOSIS — G935 Compression of brain: Secondary | ICD-10-CM | POA: Diagnosis not present

## 2022-01-15 DIAGNOSIS — D849 Immunodeficiency, unspecified: Secondary | ICD-10-CM | POA: Diagnosis not present

## 2022-01-15 DIAGNOSIS — Z48298 Encounter for aftercare following other organ transplant: Secondary | ICD-10-CM | POA: Diagnosis not present

## 2022-01-15 DIAGNOSIS — Z8619 Personal history of other infectious and parasitic diseases: Secondary | ICD-10-CM | POA: Diagnosis not present

## 2022-01-15 DIAGNOSIS — E872 Acidosis, unspecified: Secondary | ICD-10-CM | POA: Diagnosis not present

## 2022-01-15 DIAGNOSIS — Z1159 Encounter for screening for other viral diseases: Secondary | ICD-10-CM | POA: Diagnosis not present

## 2022-01-15 DIAGNOSIS — E039 Hypothyroidism, unspecified: Secondary | ICD-10-CM | POA: Diagnosis not present

## 2022-01-15 DIAGNOSIS — Z940 Kidney transplant status: Secondary | ICD-10-CM | POA: Diagnosis not present

## 2022-01-15 DIAGNOSIS — B079 Viral wart, unspecified: Secondary | ICD-10-CM | POA: Diagnosis not present

## 2022-01-15 DIAGNOSIS — Z7952 Long term (current) use of systemic steroids: Secondary | ICD-10-CM | POA: Diagnosis not present

## 2022-01-15 DIAGNOSIS — Z4822 Encounter for aftercare following kidney transplant: Secondary | ICD-10-CM | POA: Diagnosis not present

## 2022-01-15 DIAGNOSIS — I10 Essential (primary) hypertension: Secondary | ICD-10-CM | POA: Diagnosis not present

## 2022-01-15 DIAGNOSIS — N189 Chronic kidney disease, unspecified: Principal | ICD-10-CM

## 2022-01-15 DIAGNOSIS — D631 Anemia in chronic kidney disease: Principal | ICD-10-CM

## 2022-01-16 ENCOUNTER — Other Ambulatory Visit (HOSPITAL_COMMUNITY): Payer: Self-pay

## 2022-01-17 ENCOUNTER — Other Ambulatory Visit (HOSPITAL_COMMUNITY): Payer: Self-pay

## 2022-01-17 DIAGNOSIS — Z940 Kidney transplant status: Principal | ICD-10-CM

## 2022-01-20 ENCOUNTER — Other Ambulatory Visit (HOSPITAL_COMMUNITY): Payer: Self-pay

## 2022-01-20 ENCOUNTER — Other Ambulatory Visit: Payer: Self-pay

## 2022-01-22 ENCOUNTER — Other Ambulatory Visit (HOSPITAL_COMMUNITY): Payer: Self-pay

## 2022-01-22 ENCOUNTER — Other Ambulatory Visit: Payer: Self-pay

## 2022-01-23 ENCOUNTER — Other Ambulatory Visit (HOSPITAL_COMMUNITY): Payer: Self-pay

## 2022-01-24 ENCOUNTER — Institutional Professional Consult (permissible substitution): Admit: 2022-01-24 | Discharge: 2022-01-25 | Payer: PRIVATE HEALTH INSURANCE

## 2022-01-24 ENCOUNTER — Other Ambulatory Visit: Payer: Self-pay

## 2022-01-24 ENCOUNTER — Other Ambulatory Visit (HOSPITAL_COMMUNITY): Payer: Self-pay

## 2022-01-24 DIAGNOSIS — Z23 Encounter for immunization: Secondary | ICD-10-CM | POA: Diagnosis not present

## 2022-01-29 DIAGNOSIS — Z940 Kidney transplant status: Principal | ICD-10-CM

## 2022-01-29 MED ORDER — TACROLIMUS 1 MG CAPSULE, IMMEDIATE-RELEASE
ORAL_CAPSULE | Freq: Two times a day (BID) | ORAL | 3 refills | 90 days | Status: CP
Start: 2022-01-29 — End: 2023-01-29

## 2022-01-30 ENCOUNTER — Ambulatory Visit: Payer: 59 | Admitting: Psychiatry

## 2022-01-30 ENCOUNTER — Other Ambulatory Visit (HOSPITAL_COMMUNITY): Payer: Self-pay

## 2022-01-30 MED ORDER — TACROLIMUS 1 MG PO CAPS
ORAL_CAPSULE | ORAL | 3 refills | Status: DC
Start: 2022-01-29 — End: 2023-01-21
  Filled 2022-01-30: qty 540, 90d supply, fill #0
  Filled 2022-03-11 – 2022-04-23 (×2): qty 540, 90d supply, fill #1
  Filled 2022-07-31: qty 540, 90d supply, fill #2
  Filled 2022-10-29: qty 540, 90d supply, fill #3

## 2022-01-31 ENCOUNTER — Other Ambulatory Visit (HOSPITAL_COMMUNITY): Payer: Self-pay

## 2022-02-03 ENCOUNTER — Other Ambulatory Visit (HOSPITAL_COMMUNITY): Payer: Self-pay

## 2022-02-03 DIAGNOSIS — Z1159 Encounter for screening for other viral diseases: Principal | ICD-10-CM

## 2022-02-03 DIAGNOSIS — Z79899 Other long term (current) drug therapy: Principal | ICD-10-CM

## 2022-02-03 DIAGNOSIS — Z940 Kidney transplant status: Principal | ICD-10-CM

## 2022-02-05 ENCOUNTER — Other Ambulatory Visit (HOSPITAL_COMMUNITY): Payer: Self-pay

## 2022-02-07 ENCOUNTER — Ambulatory Visit (INDEPENDENT_AMBULATORY_CARE_PROVIDER_SITE_OTHER): Payer: 59

## 2022-02-07 DIAGNOSIS — Z23 Encounter for immunization: Secondary | ICD-10-CM | POA: Diagnosis not present

## 2022-02-07 NOTE — Progress Notes (Signed)
Patient came in today for HPV vaccine Gardisil 9 given in left deltoid IM. Patient tolerated well with no signs of distress.  ?

## 2022-02-17 ENCOUNTER — Other Ambulatory Visit (HOSPITAL_COMMUNITY): Payer: Self-pay

## 2022-02-17 ENCOUNTER — Telehealth: Admit: 2022-02-17 | Discharge: 2022-07-25 | Payer: PRIVATE HEALTH INSURANCE | Attending: Physician | Primary: Physician

## 2022-02-17 DIAGNOSIS — E7204 Cystinosis: Secondary | ICD-10-CM | POA: Diagnosis not present

## 2022-02-17 NOTE — Progress Notes (Unsigned)
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   PMD: Dr. Brent General Per Patient Provider  CC: infertility    10/09/2021: Unprotected intercourse for 13 month.  No pregnancy with his current partner.  No pregnancy with previous partners. Attempts to achieve pregnancy have included: no birth control.  No siblings with fertility problems, parents without fertility problems. Married x 1.5 years.    Diagnosed with cystinosis when age 31. Kidney transplant from father; 2nd more recently    Stress is a minimal problem, no toxic, heat, industrial exposures currently. 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 No cancer/chemotherapy. No fertility toxic medications.     Normal libido, erections are strong, intercourse regularly every week, no lubricants, no treatments for erectile problems.    02/17/22: Telehealth visit to review *** and discuss next steps.    Partner Fertility History:  Lurena Joiner is 30 G0 with regular periods every 28 days. She has been diagnosed with: no known fertility problems. 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    Physical Exam:  There is no height or weight on file to calculate BMI. There were no vitals filed for this visit.  Constitution:  [facility], NAD      Labs:  None relevant    A: 31 y.o. with desire for fertility and cystinosis. Enrolled in Cystinosis study. Has been trying to conceive for 13 months. Will obtain endocrine labs and semen analysis to evaluate current fertility potential. Discussed fertility treatment options including TI, IUI, and IVF pending test results.    P:  1. {SA Fellow - NOT done  2. Fsh, lh, prl, T, inhibin-B, E2,  17 OHP - NOT done  3. Scrotal US - NOT done  4. Email Swa, Jim Cystinosis study  5. TH 6-8 weeks}    I performed this evaluation using real-time telehealth tools, including a live video Zoom connection between my location and the patient's location. Prior to initiating, the patient consented to perform this evaluation using telehealth tools.     I, Duanne Moron am acting as a Neurosurgeon for services provided by Dorthula Nettles, MD on 02/16/22  6:34 PM.    Marland Kitchen

## 2022-02-25 ENCOUNTER — Encounter: Payer: Self-pay | Admitting: Psychiatry

## 2022-02-25 ENCOUNTER — Ambulatory Visit (INDEPENDENT_AMBULATORY_CARE_PROVIDER_SITE_OTHER): Payer: 59 | Admitting: Psychiatry

## 2022-02-25 VITALS — BP 122/76 | HR 76 | Ht 68.0 in | Wt 140.0 lb

## 2022-02-25 DIAGNOSIS — G935 Compression of brain: Secondary | ICD-10-CM | POA: Diagnosis not present

## 2022-02-25 DIAGNOSIS — G43009 Migraine without aura, not intractable, without status migrainosus: Secondary | ICD-10-CM | POA: Diagnosis not present

## 2022-02-25 NOTE — Progress Notes (Signed)
? ?  CC:  headaches ? ?Follow-up Visit ? ?Last visit: 10/31/21 ? ?Brief HPI: ?31 year old male with a history of kidney transplant, disseminated VZV, Chiari 1 malformation, cystinosis who follows in clinic for migraines and occipital headaches triggered by coughing. MRI brain 10/12/20: Chiari I malformation with tonsils extending 0.9 cm below foramen magnum. Slight distension of optic nerve sheath. He saw ophthalmology after this MRI who noted no papilledema. ? ?At his last visit MRI C/T/L spine was ordered. He was started on Maxalt for rescue. ? ?Interval History: ?MRI was negative for syrinx. He has been doing well with Maxalt for rescue, which resolves his headache within 30 minutes. It does not cause side effects. Currently has a migraine once every couple of weeks. His occipital headaches have also improved and are not currently bothersome to him. No vision changes. ? ? ?Headache days per month: 2 ?Headache free days per month: 28 ? ?Current Headache Regimen: ?Preventative: none ?Abortive: Maxalt 10 mg PRN ? ?Prior Therapies                                  ?Maxalt 10 mg PRN ? ?Physical Exam:  ? ?Vital Signs: ?BP 122/76   Pulse 76   Ht '5\' 8"'$  (1.727 m)   Wt 140 lb (63.5 kg)   BMI 21.29 kg/m?  ?GENERAL:  well appearing, in no acute distress, alert  ?SKIN:  Color, texture, turgor normal.  ?HEAD:  Normocephalic/atraumatic. ?RESP: normal respiratory effort ?MSK:  No gross joint deformities.  ? ?NEUROLOGICAL: ?Mental Status: Alert, oriented to person, place and time, Follows commands, and Speech fluent and appropriate. ?Cranial Nerves: PERRL, face symmetric, no dysarthria, hearing grossly intact ?Motor: moves all extremities equally ?Gait: normal-based. ? ?IMPRESSION: ?31 year old male with a history of kidney transplant, disseminated VZV, Chiari 1 malformation, cystinosis who presents for follow up of migraines and Chiari headaches. MRI C/T/L spine was negative for syrinx (personally reviewed 02/25/22). His migraines  are well-controlled with Maxalt for rescue. Occipital headaches have also improved and are not currently bothersome to him. Will continue current regimen for now. ? ?PLAN: ?-Rescue: Continue Maxalt 10 mg PRN ?-next steps: consider daily preventive if headaches increase in frequency ? ?Follow-up: 1 year or sooner if needed ? ?I spent a total of 15 minutes on the date of the service. Headache education was done. Discussed medication side effects, adverse reactions and drug interactions.  ? ?Genia Harold, MD ?02/25/22 ?9:19 AM ? ?

## 2022-02-28 ENCOUNTER — Other Ambulatory Visit (HOSPITAL_COMMUNITY): Payer: Self-pay

## 2022-03-11 ENCOUNTER — Other Ambulatory Visit (HOSPITAL_COMMUNITY): Payer: Self-pay

## 2022-03-12 ENCOUNTER — Other Ambulatory Visit (HOSPITAL_COMMUNITY): Payer: Self-pay

## 2022-03-14 ENCOUNTER — Other Ambulatory Visit (HOSPITAL_COMMUNITY): Payer: Self-pay

## 2022-03-25 DIAGNOSIS — Z79899 Other long term (current) drug therapy: Principal | ICD-10-CM

## 2022-03-25 DIAGNOSIS — Z940 Kidney transplant status: Principal | ICD-10-CM

## 2022-03-25 DIAGNOSIS — Z1159 Encounter for screening for other viral diseases: Principal | ICD-10-CM

## 2022-04-02 ENCOUNTER — Other Ambulatory Visit (HOSPITAL_COMMUNITY): Payer: Self-pay

## 2022-04-04 ENCOUNTER — Other Ambulatory Visit (HOSPITAL_COMMUNITY): Payer: Self-pay

## 2022-04-15 ENCOUNTER — Other Ambulatory Visit (HOSPITAL_COMMUNITY): Payer: Self-pay

## 2022-04-19 DIAGNOSIS — Z940 Kidney transplant status: Principal | ICD-10-CM

## 2022-04-19 DIAGNOSIS — Z79899 Other long term (current) drug therapy: Principal | ICD-10-CM

## 2022-04-19 DIAGNOSIS — Z1159 Encounter for screening for other viral diseases: Principal | ICD-10-CM

## 2022-04-21 DIAGNOSIS — Z940 Kidney transplant status: Principal | ICD-10-CM

## 2022-04-21 DIAGNOSIS — Z1159 Encounter for screening for other viral diseases: Principal | ICD-10-CM

## 2022-04-21 DIAGNOSIS — Z79899 Other long term (current) drug therapy: Principal | ICD-10-CM

## 2022-04-23 ENCOUNTER — Ambulatory Visit: Admit: 2022-04-23 | Discharge: 2022-04-24 | Payer: PRIVATE HEALTH INSURANCE

## 2022-04-23 ENCOUNTER — Other Ambulatory Visit (HOSPITAL_COMMUNITY): Payer: Self-pay

## 2022-04-23 DIAGNOSIS — Z79899 Other long term (current) drug therapy: Secondary | ICD-10-CM | POA: Diagnosis not present

## 2022-04-23 DIAGNOSIS — Z4822 Encounter for aftercare following kidney transplant: Secondary | ICD-10-CM | POA: Diagnosis not present

## 2022-04-23 DIAGNOSIS — Z1159 Encounter for screening for other viral diseases: Secondary | ICD-10-CM | POA: Diagnosis not present

## 2022-04-24 ENCOUNTER — Ambulatory Visit
Admit: 2022-04-24 | Discharge: 2022-04-25 | Payer: PRIVATE HEALTH INSURANCE | Attending: Nephrology | Primary: Nephrology

## 2022-04-24 ENCOUNTER — Other Ambulatory Visit (HOSPITAL_COMMUNITY): Payer: Self-pay

## 2022-04-24 DIAGNOSIS — Z1159 Encounter for screening for other viral diseases: Secondary | ICD-10-CM | POA: Diagnosis not present

## 2022-04-24 DIAGNOSIS — E7204 Cystinosis: Secondary | ICD-10-CM | POA: Diagnosis not present

## 2022-04-24 DIAGNOSIS — D849 Immunodeficiency, unspecified: Secondary | ICD-10-CM | POA: Diagnosis not present

## 2022-04-24 DIAGNOSIS — Z79899 Other long term (current) drug therapy: Secondary | ICD-10-CM | POA: Diagnosis not present

## 2022-04-24 DIAGNOSIS — E038 Other specified hypothyroidism: Secondary | ICD-10-CM | POA: Diagnosis not present

## 2022-04-24 DIAGNOSIS — Z940 Kidney transplant status: Secondary | ICD-10-CM | POA: Diagnosis not present

## 2022-04-24 DIAGNOSIS — B0189 Other varicella complications: Secondary | ICD-10-CM | POA: Diagnosis not present

## 2022-04-24 DIAGNOSIS — Z48298 Encounter for aftercare following other organ transplant: Secondary | ICD-10-CM | POA: Diagnosis not present

## 2022-04-24 DIAGNOSIS — Z8619 Personal history of other infectious and parasitic diseases: Secondary | ICD-10-CM | POA: Diagnosis not present

## 2022-05-13 ENCOUNTER — Other Ambulatory Visit (HOSPITAL_COMMUNITY): Payer: Self-pay

## 2022-05-19 ENCOUNTER — Other Ambulatory Visit (HOSPITAL_COMMUNITY): Payer: Self-pay

## 2022-05-19 DIAGNOSIS — Z940 Kidney transplant status: Principal | ICD-10-CM

## 2022-05-19 DIAGNOSIS — Z1159 Encounter for screening for other viral diseases: Principal | ICD-10-CM

## 2022-05-19 DIAGNOSIS — Z79899 Other long term (current) drug therapy: Principal | ICD-10-CM

## 2022-05-20 ENCOUNTER — Other Ambulatory Visit (HOSPITAL_COMMUNITY): Payer: Self-pay

## 2022-06-09 ENCOUNTER — Other Ambulatory Visit (HOSPITAL_COMMUNITY): Payer: Self-pay

## 2022-06-09 DIAGNOSIS — Z940 Kidney transplant status: Principal | ICD-10-CM

## 2022-06-09 MED ORDER — MYCOPHENOLATE MOFETIL 250 MG CAPSULE
ORAL_CAPSULE | 11 refills | 0 days
Start: 2022-06-09 — End: ?

## 2022-06-13 ENCOUNTER — Other Ambulatory Visit (HOSPITAL_COMMUNITY): Payer: Self-pay

## 2022-06-16 DIAGNOSIS — Z940 Kidney transplant status: Principal | ICD-10-CM

## 2022-06-16 DIAGNOSIS — Z1159 Encounter for screening for other viral diseases: Principal | ICD-10-CM

## 2022-06-16 DIAGNOSIS — Z79899 Other long term (current) drug therapy: Principal | ICD-10-CM

## 2022-06-28 ENCOUNTER — Other Ambulatory Visit (HOSPITAL_COMMUNITY): Payer: Self-pay

## 2022-07-01 DIAGNOSIS — Z940 Kidney transplant status: Principal | ICD-10-CM

## 2022-07-08 ENCOUNTER — Other Ambulatory Visit: Payer: Self-pay | Admitting: Psychiatry

## 2022-07-08 ENCOUNTER — Other Ambulatory Visit (HOSPITAL_COMMUNITY): Payer: Self-pay

## 2022-07-08 MED ORDER — RIZATRIPTAN BENZOATE 10 MG PO TABS
10.0000 mg | ORAL_TABLET | ORAL | 2 refills | Status: DC | PRN
  Filled 2022-07-08: qty 10, 30d supply, fill #0
  Filled 2022-10-29: qty 10, 30d supply, fill #1
  Filled 2022-12-29: qty 10, 30d supply, fill #2

## 2022-07-08 MED ORDER — PREDNISONE 5 MG PO TABS
5.0000 mg | ORAL_TABLET | Freq: Every day | ORAL | 3 refills | Status: DC
  Filled 2022-07-08: qty 90, 90d supply, fill #0
  Filled 2022-10-02: qty 90, 90d supply, fill #1
  Filled 2023-01-06: qty 90, 90d supply, fill #2

## 2022-07-08 MED ORDER — MYCOPHENOLATE MOFETIL 250 MG PO CAPS
500.0000 mg | ORAL_CAPSULE | Freq: Two times a day (BID) | ORAL | 3 refills | Status: DC
  Filled 2022-07-08 – 2022-07-22 (×3): qty 360, 90d supply, fill #0
  Filled 2022-10-13: qty 360, 90d supply, fill #1
  Filled 2023-01-06: qty 360, 90d supply, fill #2
  Filled 2023-04-20 – 2023-04-27 (×2): qty 360, 90d supply, fill #3

## 2022-07-08 MED ORDER — PREDNISONE 5 MG TABLET
ORAL_TABLET | Freq: Every day | ORAL | 3 refills | 90 days | Status: CP
Start: 2022-07-08 — End: 2023-07-08

## 2022-07-08 MED ORDER — MYCOPHENOLATE MOFETIL 250 MG CAPSULE
ORAL_CAPSULE | Freq: Two times a day (BID) | ORAL | 3 refills | 90 days | Status: CP
Start: 2022-07-08 — End: 2023-07-08

## 2022-07-09 ENCOUNTER — Other Ambulatory Visit (HOSPITAL_COMMUNITY): Payer: Self-pay

## 2022-07-14 DIAGNOSIS — Z940 Kidney transplant status: Principal | ICD-10-CM

## 2022-07-14 DIAGNOSIS — Z79899 Other long term (current) drug therapy: Principal | ICD-10-CM

## 2022-07-14 DIAGNOSIS — Z1159 Encounter for screening for other viral diseases: Principal | ICD-10-CM

## 2022-07-21 ENCOUNTER — Other Ambulatory Visit (HOSPITAL_COMMUNITY): Payer: Self-pay

## 2022-07-21 ENCOUNTER — Ambulatory Visit
Admission: EM | Admit: 2022-07-21 | Discharge: 2022-07-21 | Disposition: A | Discharge status: Home / Self Care | Payer: 59 | Attending: Urgent Care | Admitting: Urgent Care

## 2022-07-21 DIAGNOSIS — H60392 Other infective otitis externa, left ear: Secondary | ICD-10-CM | POA: Diagnosis not present

## 2022-07-21 MED ORDER — AMOXICILLIN-POT CLAVULANATE 875-125 MG PO TABS: 1.0000 | ORAL_TABLET | Freq: Two times a day (BID) | ORAL | 0 refills | Status: DC

## 2022-07-21 MED ORDER — CIPROFLOXACIN-DEXAMETHASONE 0.3-0.1 % OT SUSP: 4.0000 [drp] | Freq: Two times a day (BID) | OTIC | 0 refills | Status: AC

## 2022-07-21 NOTE — Discharge Instructions (Addendum)
Both an oral and topical antibiotic have been prescribed for otitis externa with rupture of the tympanic membrane. Recommend referral to ENT for evaluation of left ear and cleaning of the external canal.  Follow-up with your primary care provider or schedule a visit if you are not already established with an ENT.

## 2022-07-21 NOTE — ED Provider Notes (Signed)
[name]    CSN: [id number] Arrival date & time: 07/21/22  0803      History   Chief Complaint No chief complaint on file.   HPI Joshua Atkinson is a 31 y.o. male.   HPI  Presents to UC with complaint of left ear pain x2 weeks.  He reports yellow drainage from the left ear canal with tenderness when he touches it.  States his wife is a Librarian, academic and looked in the ear and saw discharge and diagnosed "otitis externa", suggesting he be seen for treatment.  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  Medical and surgical history is significant for CKD and renal transplant.  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 Authorizing Provider  Cholecalciferol 50 MCG (2000 UT) TABS Take by mouth. 03/22/21   [provider]  CYSTAGON 150 MG CAPS Take 450 capsules by mouth in the morning, at noon, in the evening, and at bedtime. 01/13/22   [provider]  levothyroxine (SYNTHROID) 50 MCG tablet Take 1 tablet by mouth  nightly. 12/03/21     mycophenolate (CELLCEPT) 250 MG capsule Take 2 capsules (500 mg total) by mouth 2 (two) times daily. 07/08/22     predniSONE (DELTASONE) 5 MG tablet Take 1 tablet (5 mg total) by mouth daily. 07/08/22     rizatriptan (MAXALT) 10 MG tablet Take 1 tablet (10 mg total) by mouth as needed for migraine. May repeat in 2 hours if needed 07/08/22   Genia Harold, MD  Specialty Vitamins Products (MAGNESIUM, AMINO ACID CHELATE,) 133 MG tablet Take 1 tablet by mouth 2 times a day. 12/03/21     Specialty Vitamins Products (MG PLUS PROTEIN) 133 MG TABS Take 1 tablet by mouth 2 times a day. 10/13/21     tacrolimus (PROGRAF) 1 MG capsule Take 3 capsules by mouth 2 times a day. 01/29/22     valACYclovir (VALTREX) 500 MG tablet Take 1 tablet (500 mg total) by mouth in the morning. 04/05/21       Family History Family History  Problem Relation Age of Onset   Arthritis Mother    Cancer Mother        breast cancer   Arthritis Father    Hypertension Father    Arthritis Maternal Grandmother    Hypertension Paternal Grandmother    Hypertension Paternal Merchant navy officer  Diabetes Paternal Grandfather     Social History Social History   Tobacco Use   Smoking status: Never   Smokeless tobacco: Never  Substance Use Topics   Alcohol use: No    Alcohol/week: 0.0 standard drinks of alcohol   Drug use: No     Allergies   Sulfa antibiotics, Advil [ibuprofen], and Septra [sulfamethoxazole-trimethoprim]   Review of Systems Review of Systems   Physical Exam Triage Vital Signs ED Triage Vitals  Enc Vitals Group     BP      Pulse      Resp      Temp      Temp src      SpO2      Weight      Height      Head Circumference      Peak Flow      Pain Score      Pain Loc      Pain Edu?      Excl. in [place]?    No data found.  Updated Vital Signs There were no vitals taken for this visit.  Visual Acuity Right Eye Distance:   Left Eye Distance:   Bilateral Distance:    Right Eye  Near:   Left Eye Near:    Bilateral Near:     Physical Exam Vitals reviewed.  Constitutional:      Appearance: Normal appearance.  HENT:     Head:     Comments: Suppurative discharge is present in the left EAC occluding visualization of the TM which the patient states is ruptured.      Left Ear: Decreased hearing noted. Drainage and tenderness present.  Skin:    General: Skin is warm and dry.  Neurological:     General: No focal deficit present.     Mental Status: He is alert and oriented to person, place, and time.  Psychiatric:        Mood and Affect: Mood normal.        Behavior: Behavior normal.      UC Treatments / Results  Labs (all labs ordered are listed, but only abnormal results are displayed) Labs Reviewed - No data to display  EKG   Radiology No results found.  Procedures Procedures (including critical care time)  Medications Ordered in UC Medications - No data to display  Initial Impression / Assessment and Plan / UC Course  I have reviewed the triage vital signs and the nursing notes.  Pertinent labs & imaging results that were available during my care of the patient were reviewed by me and considered in my medical decision making (see chart for details).   Left otitis externa with ruptured TM (per patient).  Prescribing Ciprodex which is safe for non-intact TM.  Also prescribing a 7-day course of Augmentin because I am unable to determine if there is infection in the middle ear.  Recommending patient be evaluated by ENT.   Final Clinical Impressions(s) / UC Diagnoses   Final diagnoses:  None   Discharge Instructions   None    ED Prescriptions   None    PDMP not reviewed this encounter.   Rose Phi, [place] 07/21/22 [phone]

## 2022-07-21 NOTE — ED Triage Notes (Signed)
Pt c/o left ear pain for the last few weeks w/ yellow drainage.

## 2022-07-22 ENCOUNTER — Other Ambulatory Visit (HOSPITAL_COMMUNITY): Payer: Self-pay

## 2022-07-23 ENCOUNTER — Other Ambulatory Visit (HOSPITAL_COMMUNITY): Payer: Self-pay

## 2022-07-29 ENCOUNTER — Ambulatory Visit: Admit: 2022-07-29 | Discharge: 2022-07-30 | Payer: PRIVATE HEALTH INSURANCE

## 2022-07-29 DIAGNOSIS — B079 Viral wart, unspecified: Secondary | ICD-10-CM | POA: Diagnosis not present

## 2022-07-29 MED ORDER — SQUARIC ACID 0.5 % IN ACETONE
2 refills | 0.00000 days | Status: CP
Start: 2022-07-29 — End: ?

## 2022-07-30 DIAGNOSIS — N186 End stage renal disease: Principal | ICD-10-CM

## 2022-07-30 DIAGNOSIS — Z1159 Encounter for screening for other viral diseases: Principal | ICD-10-CM

## 2022-07-30 DIAGNOSIS — Z79899 Other long term (current) drug therapy: Principal | ICD-10-CM

## 2022-07-30 DIAGNOSIS — Z940 Kidney transplant status: Principal | ICD-10-CM

## 2022-07-31 ENCOUNTER — Ambulatory Visit: Admit: 2022-07-31 | Discharge: 2022-07-31 | Payer: PRIVATE HEALTH INSURANCE

## 2022-07-31 ENCOUNTER — Ambulatory Visit
Admit: 2022-07-31 | Discharge: 2022-07-31 | Payer: PRIVATE HEALTH INSURANCE | Attending: Nephrology | Primary: Nephrology

## 2022-07-31 ENCOUNTER — Other Ambulatory Visit (HOSPITAL_COMMUNITY): Payer: Self-pay

## 2022-07-31 DIAGNOSIS — Z940 Kidney transplant status: Secondary | ICD-10-CM | POA: Diagnosis not present

## 2022-07-31 DIAGNOSIS — E7204 Cystinosis: Secondary | ICD-10-CM | POA: Diagnosis not present

## 2022-07-31 DIAGNOSIS — D849 Immunodeficiency, unspecified: Secondary | ICD-10-CM | POA: Diagnosis not present

## 2022-07-31 DIAGNOSIS — N271 Small kidney, bilateral: Secondary | ICD-10-CM | POA: Diagnosis not present

## 2022-07-31 DIAGNOSIS — R809 Proteinuria, unspecified: Secondary | ICD-10-CM | POA: Diagnosis not present

## 2022-07-31 DIAGNOSIS — D84821 Immunocompromised state due to drug therapy (CMS-HCC): Principal | ICD-10-CM

## 2022-07-31 DIAGNOSIS — Z48298 Encounter for aftercare following other organ transplant: Secondary | ICD-10-CM | POA: Diagnosis not present

## 2022-07-31 DIAGNOSIS — N2889 Other specified disorders of kidney and ureter: Secondary | ICD-10-CM | POA: Diagnosis not present

## 2022-07-31 DIAGNOSIS — E038 Other specified hypothyroidism: Secondary | ICD-10-CM | POA: Diagnosis not present

## 2022-07-31 DIAGNOSIS — D72819 Decreased white blood cell count, unspecified: Secondary | ICD-10-CM | POA: Diagnosis not present

## 2022-07-31 DIAGNOSIS — G935 Compression of brain: Secondary | ICD-10-CM | POA: Diagnosis not present

## 2022-07-31 DIAGNOSIS — Z8619 Personal history of other infectious and parasitic diseases: Secondary | ICD-10-CM | POA: Diagnosis not present

## 2022-07-31 DIAGNOSIS — N186 End stage renal disease: Secondary | ICD-10-CM | POA: Diagnosis not present

## 2022-07-31 DIAGNOSIS — Z79899 Other long term (current) drug therapy: Secondary | ICD-10-CM | POA: Diagnosis not present

## 2022-07-31 DIAGNOSIS — E872 Acidosis, unspecified: Secondary | ICD-10-CM | POA: Diagnosis not present

## 2022-07-31 DIAGNOSIS — I120 Hypertensive chronic kidney disease with stage 5 chronic kidney disease or end stage renal disease: Secondary | ICD-10-CM | POA: Diagnosis not present

## 2022-07-31 DIAGNOSIS — Z1159 Encounter for screening for other viral diseases: Principal | ICD-10-CM

## 2022-07-31 DIAGNOSIS — Z0000 Encounter for general adult medical examination without abnormal findings: Principal | ICD-10-CM

## 2022-08-01 ENCOUNTER — Other Ambulatory Visit (HOSPITAL_COMMUNITY): Payer: Self-pay

## 2022-08-04 ENCOUNTER — Other Ambulatory Visit (HOSPITAL_COMMUNITY): Payer: Self-pay

## 2022-08-11 DIAGNOSIS — H60332 Swimmer's ear, left ear: Secondary | ICD-10-CM | POA: Diagnosis not present

## 2022-08-11 DIAGNOSIS — H6122 Impacted cerumen, left ear: Secondary | ICD-10-CM | POA: Diagnosis not present

## 2022-08-11 DIAGNOSIS — Z940 Kidney transplant status: Principal | ICD-10-CM

## 2022-08-11 DIAGNOSIS — Z1159 Encounter for screening for other viral diseases: Principal | ICD-10-CM

## 2022-08-11 DIAGNOSIS — Z79899 Other long term (current) drug therapy: Principal | ICD-10-CM

## 2022-08-12 DIAGNOSIS — Z940 Kidney transplant status: Principal | ICD-10-CM

## 2022-08-12 DIAGNOSIS — E7204 Cystinosis: Principal | ICD-10-CM

## 2022-09-05 DIAGNOSIS — E7204 Cystinosis: Principal | ICD-10-CM

## 2022-09-05 MED ORDER — CYSTAGON 150 MG CAPSULE
ORAL_CAPSULE | 7 refills | 0 days | Status: CP
Start: 2022-09-05 — End: ?

## 2022-09-08 DIAGNOSIS — Z79899 Other long term (current) drug therapy: Principal | ICD-10-CM

## 2022-09-08 DIAGNOSIS — Z940 Kidney transplant status: Principal | ICD-10-CM

## 2022-09-08 DIAGNOSIS — Z1159 Encounter for screening for other viral diseases: Principal | ICD-10-CM

## 2022-09-11 ENCOUNTER — Ambulatory Visit: Admit: 2022-09-11 | Discharge: 2022-09-12 | Payer: PRIVATE HEALTH INSURANCE

## 2022-09-11 DIAGNOSIS — B079 Viral wart, unspecified: Secondary | ICD-10-CM | POA: Diagnosis not present

## 2022-09-16 ENCOUNTER — Other Ambulatory Visit (HOSPITAL_COMMUNITY): Payer: Self-pay

## 2022-09-17 ENCOUNTER — Other Ambulatory Visit (HOSPITAL_COMMUNITY): Payer: Self-pay

## 2022-10-02 ENCOUNTER — Other Ambulatory Visit (HOSPITAL_COMMUNITY): Payer: Self-pay

## 2022-10-06 DIAGNOSIS — Z940 Kidney transplant status: Principal | ICD-10-CM

## 2022-10-06 DIAGNOSIS — Z1159 Encounter for screening for other viral diseases: Principal | ICD-10-CM

## 2022-10-06 DIAGNOSIS — Z79899 Other long term (current) drug therapy: Principal | ICD-10-CM

## 2022-10-08 DIAGNOSIS — E7204 Cystinosis: Secondary | ICD-10-CM | POA: Diagnosis not present

## 2022-10-13 ENCOUNTER — Other Ambulatory Visit (HOSPITAL_COMMUNITY): Payer: Self-pay

## 2022-10-29 ENCOUNTER — Other Ambulatory Visit (HOSPITAL_COMMUNITY): Payer: Self-pay

## 2022-10-30 ENCOUNTER — Other Ambulatory Visit: Payer: Self-pay

## 2022-11-03 DIAGNOSIS — Z1159 Encounter for screening for other viral diseases: Principal | ICD-10-CM

## 2022-11-03 DIAGNOSIS — Z79899 Other long term (current) drug therapy: Principal | ICD-10-CM

## 2022-11-03 DIAGNOSIS — Z940 Kidney transplant status: Principal | ICD-10-CM

## 2022-11-12 ENCOUNTER — Ambulatory Visit: Payer: 59 | Admitting: Family Medicine

## 2022-11-12 VITALS — BP 125/81 | HR 103 | Temp 98.8°F | Ht 67.0 in | Wt 140.4 lb

## 2022-11-12 DIAGNOSIS — Z0001 Encounter for general adult medical examination with abnormal findings: Secondary | ICD-10-CM | POA: Diagnosis not present

## 2022-11-12 DIAGNOSIS — Z0000 Encounter for general adult medical examination without abnormal findings: Secondary | ICD-10-CM

## 2022-11-12 DIAGNOSIS — E7204 Cystinosis: Secondary | ICD-10-CM

## 2022-11-12 NOTE — Progress Notes (Signed)
   Subjective:    Patient ID: RYEN RHAMES, male    DOB: May 24, 1991, 32 y.o.   MRN: [id number]  HPI Patient arrives today to establish care. The patient comes in today for a wellness visit.    A review of their health history was completed.  A review of medications was also completed.  Any needed refills; none  Eating habits: Tries to eat healthy most of the time  Falls/  MVA accidents in past few months: No accidents or injuries or falls  Regular exercise: Does some CrossFit.  Also regular physical activity  Specialist pt sees on regular basis: Does see nephrology and transplant specialists at [facility] health issues were discussed.   Additional concerns: None  Not depressed  Enjoys deer hunting  Working currently, also going to [employer] college  Review of Systems     Objective:   Physical Exam General-in no acute distress Eyes-no discharge Lungs-respiratory rate normal, CTA CV-no murmurs,RRR Extremities skin warm dry no edema Neuro grossly normal Behavior normal, alert        Assessment & Plan:  1. Well adult exam Adult wellness-complete.wellness physical was conducted today. Importance of diet and exercise were discussed in detail.  Importance of stress reduction and healthy living were discussed.  In addition to this a discussion regarding safety was also covered.  We also reviewed over immunizations and gave recommendations regarding current immunization needed for age.   In addition to this additional areas were also touched on including: Preventative health exams needed:  Colonoscopy not indicated currently  Patient was advised yearly wellness exam   2. 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

## 2022-11-25 DIAGNOSIS — Z79899 Other long term (current) drug therapy: Principal | ICD-10-CM

## 2022-11-25 DIAGNOSIS — Z1159 Encounter for screening for other viral diseases: Principal | ICD-10-CM

## 2022-11-25 DIAGNOSIS — Z940 Kidney transplant status: Principal | ICD-10-CM

## 2022-12-01 DIAGNOSIS — Z1159 Encounter for screening for other viral diseases: Principal | ICD-10-CM

## 2022-12-01 DIAGNOSIS — Z940 Kidney transplant status: Principal | ICD-10-CM

## 2022-12-01 DIAGNOSIS — Z79899 Other long term (current) drug therapy: Principal | ICD-10-CM

## 2022-12-02 ENCOUNTER — Other Ambulatory Visit: Admit: 2022-12-02 | Discharge: 2022-12-03 | Payer: PRIVATE HEALTH INSURANCE

## 2022-12-03 ENCOUNTER — Ambulatory Visit: Admit: 2022-12-03 | Discharge: 2022-12-04 | Payer: PRIVATE HEALTH INSURANCE

## 2022-12-03 DIAGNOSIS — Z940 Kidney transplant status: Secondary | ICD-10-CM | POA: Diagnosis not present

## 2022-12-03 DIAGNOSIS — E7204 Cystinosis: Secondary | ICD-10-CM | POA: Diagnosis not present

## 2022-12-03 MED ORDER — TACROLIMUS 1 MG CAPSULE, IMMEDIATE-RELEASE
ORAL_CAPSULE | ORAL | 3 refills | 90 days | Status: CP
Start: 2022-12-03 — End: 2023-12-03

## 2022-12-04 ENCOUNTER — Ambulatory Visit
Admit: 2022-12-04 | Discharge: 2022-12-04 | Payer: PRIVATE HEALTH INSURANCE | Attending: Nephrology | Primary: Nephrology

## 2022-12-04 ENCOUNTER — Ambulatory Visit: Admit: 2022-12-04 | Discharge: 2022-12-04 | Payer: PRIVATE HEALTH INSURANCE

## 2022-12-04 ENCOUNTER — Other Ambulatory Visit: Payer: Self-pay

## 2022-12-04 ENCOUNTER — Other Ambulatory Visit (HOSPITAL_COMMUNITY): Payer: Self-pay

## 2022-12-04 DIAGNOSIS — Z79899 Other long term (current) drug therapy: Secondary | ICD-10-CM | POA: Diagnosis not present

## 2022-12-04 DIAGNOSIS — E038 Other specified hypothyroidism: Secondary | ICD-10-CM | POA: Diagnosis not present

## 2022-12-04 DIAGNOSIS — Z1159 Encounter for screening for other viral diseases: Secondary | ICD-10-CM | POA: Diagnosis not present

## 2022-12-04 DIAGNOSIS — R21 Rash and other nonspecific skin eruption: Secondary | ICD-10-CM | POA: Diagnosis not present

## 2022-12-04 DIAGNOSIS — Z48298 Encounter for aftercare following other organ transplant: Secondary | ICD-10-CM | POA: Diagnosis not present

## 2022-12-04 DIAGNOSIS — Z940 Kidney transplant status: Secondary | ICD-10-CM | POA: Diagnosis not present

## 2022-12-04 DIAGNOSIS — E7204 Cystinosis: Secondary | ICD-10-CM | POA: Diagnosis not present

## 2022-12-04 DIAGNOSIS — D849 Immunodeficiency, unspecified: Secondary | ICD-10-CM | POA: Diagnosis not present

## 2022-12-04 DIAGNOSIS — Z8619 Personal history of other infectious and parasitic diseases: Secondary | ICD-10-CM | POA: Diagnosis not present

## 2022-12-04 MED ORDER — SODIUM BICARBONATE 650 MG PO TABS
1300.0000 mg | ORAL_TABLET | Freq: Two times a day (BID) | ORAL | 3 refills | Status: AC
  Filled 2022-12-04: qty 360, 90d supply, fill #0

## 2022-12-04 MED ORDER — SODIUM BICARBONATE 650 MG TABLET
ORAL_TABLET | Freq: Two times a day (BID) | ORAL | 3 refills | 90.00000 days | Status: CP
Start: 2022-12-04 — End: 2023-12-04

## 2022-12-06 DIAGNOSIS — Z79899 Other long term (current) drug therapy: Principal | ICD-10-CM

## 2022-12-06 DIAGNOSIS — Z940 Kidney transplant status: Principal | ICD-10-CM

## 2022-12-06 DIAGNOSIS — Z1159 Encounter for screening for other viral diseases: Principal | ICD-10-CM

## 2022-12-13 DIAGNOSIS — Z940 Kidney transplant status: Principal | ICD-10-CM

## 2022-12-13 DIAGNOSIS — D84821 Immunocompromised state due to drug therapy (CMS-HCC): Principal | ICD-10-CM

## 2022-12-13 DIAGNOSIS — Z79899 Other long term (current) drug therapy: Principal | ICD-10-CM

## 2022-12-13 DIAGNOSIS — E7204 Cystinosis: Principal | ICD-10-CM

## 2022-12-23 ENCOUNTER — Other Ambulatory Visit (HOSPITAL_COMMUNITY): Payer: Self-pay

## 2022-12-23 MED ORDER — LEVOTHYROXINE 50 MCG TABLET
ORAL_TABLET | Freq: Every evening | ORAL | 11 refills | 0 days
Start: 2022-12-23 — End: ?

## 2022-12-24 ENCOUNTER — Other Ambulatory Visit (HOSPITAL_COMMUNITY): Payer: Self-pay

## 2022-12-24 MED ORDER — LEVOTHYROXINE 50 MCG TABLET
ORAL_TABLET | Freq: Every evening | ORAL | 11 refills | 30 days | Status: CP
Start: 2022-12-24 — End: ?

## 2022-12-25 ENCOUNTER — Other Ambulatory Visit (HOSPITAL_COMMUNITY): Payer: Self-pay

## 2022-12-25 MED ORDER — LEVOTHYROXINE SODIUM 50 MCG PO TABS
50.0000 ug | ORAL_TABLET | Freq: Every day | ORAL | 11 refills | Status: DC
  Filled 2022-12-25: qty 90, 90d supply, fill #0
  Filled 2022-12-25: qty 30, 30d supply, fill #0
  Filled 2023-03-20: qty 90, 90d supply, fill #1
  Filled 2023-07-13: qty 90, 90d supply, fill #2
  Filled 2023-10-05: qty 90, 90d supply, fill #3

## 2022-12-29 ENCOUNTER — Other Ambulatory Visit (HOSPITAL_COMMUNITY): Payer: Self-pay

## 2022-12-29 DIAGNOSIS — Z1159 Encounter for screening for other viral diseases: Principal | ICD-10-CM

## 2022-12-29 DIAGNOSIS — Z79899 Other long term (current) drug therapy: Principal | ICD-10-CM

## 2022-12-29 DIAGNOSIS — Z940 Kidney transplant status: Principal | ICD-10-CM

## 2023-01-06 ENCOUNTER — Other Ambulatory Visit (HOSPITAL_COMMUNITY): Payer: Self-pay

## 2023-01-07 ENCOUNTER — Other Ambulatory Visit (HOSPITAL_COMMUNITY): Payer: Self-pay

## 2023-01-07 ENCOUNTER — Other Ambulatory Visit: Payer: Self-pay

## 2023-01-20 ENCOUNTER — Other Ambulatory Visit (HOSPITAL_COMMUNITY): Payer: Self-pay

## 2023-01-20 DIAGNOSIS — Z940 Kidney transplant status: Principal | ICD-10-CM

## 2023-01-20 MED ORDER — TACROLIMUS 1 MG CAPSULE, IMMEDIATE-RELEASE
ORAL_CAPSULE | 3 refills | 0 days
Start: 2023-01-20 — End: ?

## 2023-01-21 ENCOUNTER — Other Ambulatory Visit: Payer: Self-pay

## 2023-01-21 ENCOUNTER — Other Ambulatory Visit (HOSPITAL_COMMUNITY): Payer: Self-pay

## 2023-01-21 MED ORDER — TACROLIMUS 1 MG PO CAPS
3.0000 mg | ORAL_CAPSULE | Freq: Two times a day (BID) | ORAL | 3 refills | Status: DC
  Filled 2023-01-21 – 2024-01-06 (×4): qty 540, 90d supply, fill #0

## 2023-01-21 MED ORDER — TACROLIMUS 1 MG CAPSULE, IMMEDIATE-RELEASE
ORAL_CAPSULE | 3 refills | 0 days | Status: CP
Start: 2023-01-21 — End: ?

## 2023-01-22 ENCOUNTER — Other Ambulatory Visit: Payer: Self-pay

## 2023-01-23 ENCOUNTER — Other Ambulatory Visit: Payer: Self-pay

## 2023-01-26 DIAGNOSIS — Z79899 Other long term (current) drug therapy: Principal | ICD-10-CM

## 2023-01-26 DIAGNOSIS — Z1159 Encounter for screening for other viral diseases: Principal | ICD-10-CM

## 2023-01-26 DIAGNOSIS — Z940 Kidney transplant status: Principal | ICD-10-CM

## 2023-01-28 ENCOUNTER — Other Ambulatory Visit: Payer: Self-pay

## 2023-01-28 ENCOUNTER — Other Ambulatory Visit (HOSPITAL_COMMUNITY): Payer: Self-pay

## 2023-01-28 DIAGNOSIS — Z940 Kidney transplant status: Principal | ICD-10-CM

## 2023-01-28 MED ORDER — TACROLIMUS 1 MG PO CAPS
ORAL_CAPSULE | ORAL | 3 refills | Status: AC
  Filled 2023-01-28: qty 630, 90d supply, fill #0
  Filled 2023-05-12: qty 630, 90d supply, fill #1
  Filled 2023-07-20 – 2023-07-28 (×2): qty 630, 90d supply, fill #2
  Filled 2023-10-23: qty 630, 90d supply, fill #3

## 2023-01-28 MED ORDER — TACROLIMUS 1 MG CAPSULE, IMMEDIATE-RELEASE
ORAL_CAPSULE | ORAL | 3 refills | 90 days | Status: CP
Start: 2023-01-28 — End: 2024-01-28

## 2023-01-29 ENCOUNTER — Other Ambulatory Visit (HOSPITAL_COMMUNITY): Payer: Self-pay

## 2023-01-31 ENCOUNTER — Telehealth: Payer: 59 | Admitting: Nurse Practitioner

## 2023-01-31 DIAGNOSIS — R051 Acute cough: Secondary | ICD-10-CM

## 2023-01-31 MED ORDER — BENZONATATE 100 MG PO CAPS: 100.0000 mg | ORAL_CAPSULE | Freq: Three times a day (TID) | ORAL | 0 refills | Status: DC | PRN

## 2023-01-31 MED ORDER — PREDNISONE 20 MG PO TABS: 40.0000 mg | ORAL_TABLET | Freq: Every day | ORAL | 0 refills | Status: AC

## 2023-01-31 NOTE — Patient Instructions (Signed)
Vedia Coffer, thank you for joining Bennie Pierini, FNP for today's virtual visit.  While this provider is not your primary care provider (PCP), if your PCP is located in our provider database this encounter information will be shared with them immediately following your visit.   A [facility] MyChart account gives you access to today's visit and all your visits, tests, and labs performed at [facility] " click here if you don't have a [facility] MyChart account or go to [email or web address]  Consent: (Patient) Joshua Atkinson provided verbal consent for this virtual visit at the beginning of the encounter.  Current Medications:  Current Outpatient Medications:    benzonatate (TESSALON PERLES) 100 MG capsule, Take 1 capsule (100 mg total) by mouth 3 (three) times daily as needed., Disp: 20 capsule, Rfl: 0   predniSONE (DELTASONE) 20 MG tablet, Take 2 tablets (40 mg total) by mouth daily with breakfast for 5 days. 2 po daily for 5 days, Disp: 10 tablet, Rfl: 0   Cholecalciferol 50 MCG (2000 UT) TABS, Take by mouth., Disp: , Rfl:    CYSTAGON 150 MG CAPS, Take 450 capsules by mouth in the morning, at noon, in the evening, and at bedtime., Disp: , Rfl:    levothyroxine (SYNTHROID) 50 MCG tablet, Take 1 tablet by mouth nightly., Disp: 30 tablet, Rfl: 11   mycophenolate (CELLCEPT) 250 MG capsule, Take 2 capsules (500 mg total) by mouth 2 (two) times daily., Disp: 360 capsule, Rfl: 3   rizatriptan (MAXALT) 10 MG tablet, Take 1 tablet (10 mg total) by mouth as needed for migraine. May repeat in 2 hours if needed, Disp: 10 tablet, Rfl: 2   sodium bicarbonate 650 MG tablet, Take 2 tablets (1,300 mg total) by mouth 2 (two) times daily., Disp: 360 tablet, Rfl: 3   Specialty Vitamins Products (MAGNESIUM, AMINO ACID CHELATE,) 133 MG tablet, Take 1 tablet by mouth 2 times a day., Disp: 60 tablet, Rfl: 11   Specialty Vitamins Products (MG PLUS PROTEIN) 133 MG TABS, Take 1 tablet  by mouth 2 times a day., Disp: 60 tablet, Rfl: 11   Squaric Acid Dibutylester LIQD, by Does not apply route., Disp: , Rfl:    tacrolimus (PROGRAF) 1 MG capsule, Take 3 capsules (3 mg total) by mouth 2 (two) times daily., Disp: 540 capsule, Rfl: 3   tacrolimus (PROGRAF) 1 MG capsule, Take 4 capsules (4 mg total) by mouth daily AND 3 capsules (3 mg total) Nightly., Disp: 630 capsule, Rfl: 3   valACYclovir (VALTREX) 500 MG tablet, Take 1 tablet (500 mg total) by mouth in the morning. (Patient not taking: Reported on 11/12/2022), Disp: 90 tablet, Rfl: 2   Medications ordered in this encounter:  Meds ordered this encounter  Medications   predniSONE (DELTASONE) 20 MG tablet    Sig: Take 2 tablets (40 mg total) by mouth daily with breakfast for 5 days. 2 po daily for 5 days    Dispense:  10 tablet    Refill:  0    Order Specific Question:   Supervising Provider    Answer:   Merrilee Jansky [[id number]]   benzonatate (TESSALON PERLES) 100 MG capsule    Sig: Take 1 capsule (100 mg total) by mouth 3 (three) times daily as needed.    Dispense:  20 capsule    Refill:  0    Order Specific Question:   Supervising Provider    Answer:   Merrilee Jansky [id number]     *If  you need refills on other medications prior to your next appointment, please contact your pharmacy*  Follow-Up: Call back or seek an in-person evaluation if the symptoms worsen or if the condition fails to improve as anticipated.  [facility] [phone]  Other Instructions 1. Take meds as prescribed 2. Use a cool mist humidifier especially during the winter months and when heat has been humid. 3. Use saline nose sprays frequently 4. Saline irrigations of the nose can be very helpful if done frequently.  * 4X daily for 1 week*  * Use of a nettie pot can be helpful with this. Follow directions with this* 5. Drink plenty of fluids 6. Keep thermostat turn down low 7.For any cough or congestion- tessalon perles 8.  For fever or aces or pains- take tylenol or ibuprofen appropriate for age and weight.  * for fevers greater than 101 orally you may alternate ibuprofen and tylenol every  3 hours.      If you have been instructed to have an in-person evaluation today at a local Urgent Care facility, please use the link below. It will take you to a list of all of our available [facility], including address, phone number and hours of operation. Please do not delay care.  [facility]  If you or a family member do not have a primary care provider, use the link below to schedule a visit and establish care. When you choose a [facility] primary care physician or advanced practice provider, you gain a long-term partner in health. Find a Primary Care Provider  Learn more about [facility]'s in-office and virtual care options: [facility] - Get Care Now

## 2023-01-31 NOTE — Progress Notes (Signed)
Virtual Visit Consent   Joshua CofferRyan C Klahr, you are scheduled for a virtual visit with Mary-Margaret Daphine DeutscherMartin, FNP, a [facility] provider, today.     Just as with appointments in the office, your consent must be obtained to participate.  Your consent will be active for this visit and any virtual visit you may have with one of our providers in the next 365 days.     If you have a MyChart account, a copy of this consent can be sent to you electronically.  All virtual visits are billed to your insurance company just like a traditional visit in the office.    As this is a virtual visit, video technology does not allow for your provider to perform a traditional examination.  This may limit your provider's ability to fully assess your condition.  If your provider identifies any concerns that need to be evaluated in person or the need to arrange testing (such as labs, EKG, etc.), we will make arrangements to do so.     Although advances in technology are sophisticated, we cannot ensure that it will always work on either your end or our end.  If the connection with a video visit is poor, the visit may have to be switched to a telephone visit.  With either a video or telephone visit, we are not always able to ensure that we have a secure connection.     I need to obtain your verbal consent now.   Are you willing to proceed with your visit today? YES   Joshua CofferRyan C Fallert has provided verbal consent on 01/31/2023 for a virtual visit (video or telephone).   Mary-Margaret Daphine DeutscherMartin, FNP   Date: 01/31/2023 6:21 PM   Virtual Visit via Video Note   I, Mary-Margaret Daphine DeutscherMartin, connected with Joshua CofferRyan C Martorana ([id number], 1991/05/25) on 01/31/23 at  6:15 PM EDT by a video-enabled telemedicine application and verified that I am speaking with the correct person using two identifiers.  Location: Patient: Virtual Visit Location Patient: Home Provider: Virtual Visit Location Provider: Mobile   I discussed the  limitations of evaluation and management by telemedicine and the availability of in person appointments. The patient expressed understanding and agreed to proceed.    History of Present Illness: Joshua Atkinson is a 32 y.o. who identifies as a male who was assigned male at birth, and is being seen today cough  .  HPI: Wife PA and says that he has had a cough for 3 weeks. Has become productive  Kidney and liver function stable transplant patient- immunocompromised  Cough This is a new problem. Associated symptoms include nasal congestion and rhinorrhea. Pertinent negatives include no ear congestion, ear pain, fever, rash or shortness of breath. The symptoms are aggravated by other.    Review of Systems  Constitutional:  Negative for fever.  HENT:  Positive for rhinorrhea. Negative for ear pain.   Respiratory:  Positive for cough. Negative for shortness of breath.   Skin:  Negative for rash.    Problems:  Patient Active Problem List   Diagnosis Date Noted   Influenza A 12/05/2018   Kidney replaced by transplant 08/05/2016   URI (upper respiratory infection) 02/26/2016   Fever, unspecified 02/26/2016   Cystinosis 03/06/2013   Chronic kidney disease 03/06/2013   Environmental allergies 03/06/2013    Allergies:  Allergies  Allergen Reactions   Sulfa Antibiotics Hives    Other reaction(s): SWELLING/EDEMA Other reaction(s): SWELLING/EDEMA    Advil [Ibuprofen]  Kidney problems   Dapsone    Septra [Sulfamethoxazole-Trimethoprim]     Nephrotic Syndrome   Medications:  Current Outpatient Medications:    Cholecalciferol 50 MCG (2000 UT) TABS, Take by mouth., Disp: , Rfl:    CYSTAGON 150 MG CAPS, Take 450 capsules by mouth in the morning, at noon, in the evening, and at bedtime., Disp: , Rfl:    levothyroxine (SYNTHROID) 50 MCG tablet, Take 1 tablet by mouth nightly., Disp: 30 tablet, Rfl: 11   mycophenolate (CELLCEPT) 250 MG capsule, Take 2 capsules (500 mg total) by mouth 2  (two) times daily., Disp: 360 capsule, Rfl: 3   predniSONE (DELTASONE) 5 MG tablet, Take 1 tablet (5 mg total) by mouth daily., Disp: 90 tablet, Rfl: 3   rizatriptan (MAXALT) 10 MG tablet, Take 1 tablet (10 mg total) by mouth as needed for migraine. May repeat in 2 hours if needed, Disp: 10 tablet, Rfl: 2   sodium bicarbonate 650 MG tablet, Take 2 tablets (1,300 mg total) by mouth 2 (two) times daily., Disp: 360 tablet, Rfl: 3   Specialty Vitamins Products (MAGNESIUM, AMINO ACID CHELATE,) 133 MG tablet, Take 1 tablet by mouth 2 times a day., Disp: 60 tablet, Rfl: 11   Specialty Vitamins Products (MG PLUS PROTEIN) 133 MG TABS, Take 1 tablet by mouth 2 times a day., Disp: 60 tablet, Rfl: 11   Squaric Acid Dibutylester LIQD, by Does not apply route., Disp: , Rfl:    tacrolimus (PROGRAF) 1 MG capsule, Take 3 capsules (3 mg total) by mouth 2 (two) times daily., Disp: 540 capsule, Rfl: 3   tacrolimus (PROGRAF) 1 MG capsule, Take 4 capsules (4 mg total) by mouth daily AND 3 capsules (3 mg total) Nightly., Disp: 630 capsule, Rfl: 3   valACYclovir (VALTREX) 500 MG tablet, Take 1 tablet (500 mg total) by mouth in the morning. (Patient not taking: Reported on 11/12/2022), Disp: 90 tablet, Rfl: 2  Observations/Objective: Patient is well-developed, well-nourished in no acute distress.  Resting comfortably  at home.  Head is normocephalic, atraumatic.  No labored breathing.  Speech is clear and coherent with logical content.  Patient is alert and oriented at baseline.  Wife says diminished on left. Rhonchi throughout   Assessment and Plan:  CHAYDEN SOUTHWOOD in today with chief complaint of No chief complaint on file.   1. Acute cough 1. Take meds as prescribed 2. Use a cool mist humidifier especially during the winter months and when heat has been humid. 3. Use saline nose sprays frequently 4. Saline irrigations of the nose can be very helpful if done frequently.  * 4X daily for 1 week*  * Use of a  nettie pot can be helpful with this. Follow directions with this* 5. Drink plenty of fluids 6. Keep thermostat turn down low 7.For any cough or congestion- tessalon perles 8. For fever or aces or pains- take tylenol or ibuprofen appropriate for age and weight.  * for fevers greater than 101 orally you may alternate ibuprofen and tylenol every  3 hours.   Meds ordered this encounter  Medications   predniSONE (DELTASONE) 20 MG tablet    Sig: Take 2 tablets (40 mg total) by mouth daily with breakfast for 5 days. 2 po daily for 5 days    Dispense:  10 tablet    Refill:  0    Order Specific Question:   Supervising Provider    Answer:   Merrilee Jansky [[id number]]   benzonatate (TESSALON PERLES)  100 MG capsule    Sig: Take 1 capsule (100 mg total) by mouth 3 (three) times daily as needed.    Dispense:  20 capsule    Refill:  0    Order Specific Question:   Supervising Provider    Answer:   Merrilee Jansky [id number]       Follow Up Instructions: I discussed the assessment and treatment plan with the patient. The patient was provided an opportunity to ask questions and all were answered. The patient agreed with the plan and demonstrated an understanding of the instructions.  A copy of instructions were sent to the patient via MyChart.  The patient was advised to call back or seek an in-person evaluation if the symptoms worsen or if the condition fails to improve as anticipated.  Time:  I spent 7 minutes with the patient via telehealth technology discussing the above problems/concerns.    Mary-Margaret Daphine Deutscher, FNP

## 2023-02-23 ENCOUNTER — Other Ambulatory Visit (HOSPITAL_COMMUNITY): Payer: Self-pay

## 2023-02-23 ENCOUNTER — Other Ambulatory Visit: Payer: Self-pay | Admitting: Psychiatry

## 2023-02-23 DIAGNOSIS — Z940 Kidney transplant status: Principal | ICD-10-CM

## 2023-02-23 DIAGNOSIS — Z1159 Encounter for screening for other viral diseases: Principal | ICD-10-CM

## 2023-02-23 DIAGNOSIS — Z79899 Other long term (current) drug therapy: Principal | ICD-10-CM

## 2023-02-24 ENCOUNTER — Other Ambulatory Visit (HOSPITAL_COMMUNITY): Payer: Self-pay

## 2023-02-24 ENCOUNTER — Other Ambulatory Visit: Payer: Self-pay

## 2023-02-24 MED ORDER — RIZATRIPTAN BENZOATE 10 MG PO TABS
10.0000 mg | ORAL_TABLET | ORAL | 0 refills | Status: DC | PRN
  Filled 2023-02-24: qty 10, 30d supply, fill #0

## 2023-02-24 NOTE — Telephone Encounter (Signed)
Pt last seen on 02/25/22 per note "Continue Maxalt 10 mg PRN "  No 1 year  follow up visit scheduled   Last seen on 12/30/22 #10 tablets (30 day supply)

## 2023-03-12 DIAGNOSIS — Z940 Kidney transplant status: Principal | ICD-10-CM

## 2023-03-12 DIAGNOSIS — Z1159 Encounter for screening for other viral diseases: Principal | ICD-10-CM

## 2023-03-12 DIAGNOSIS — Z79899 Other long term (current) drug therapy: Principal | ICD-10-CM

## 2023-03-20 ENCOUNTER — Other Ambulatory Visit (HOSPITAL_COMMUNITY): Payer: Self-pay

## 2023-03-20 ENCOUNTER — Other Ambulatory Visit: Payer: Self-pay

## 2023-03-20 MED ORDER — PREDNISONE 5 MG PO TABS
5.0000 mg | ORAL_TABLET | Freq: Every day | ORAL | 3 refills | Status: DC
  Filled 2023-03-20: qty 90, 90d supply, fill #0
  Filled 2023-07-13: qty 90, 90d supply, fill #1
  Filled 2023-10-05: qty 90, 90d supply, fill #2
  Filled 2024-01-05 – 2024-01-06 (×2): qty 90, 90d supply, fill #3

## 2023-03-20 MED ORDER — PREDNISONE 5 MG TABLET
ORAL_TABLET | Freq: Every day | ORAL | 3 refills | 90 days | Status: CP
Start: 2023-03-20 — End: 2024-03-19

## 2023-04-02 DIAGNOSIS — Z1159 Encounter for screening for other viral diseases: Principal | ICD-10-CM

## 2023-04-02 DIAGNOSIS — Z940 Kidney transplant status: Principal | ICD-10-CM

## 2023-04-02 DIAGNOSIS — Z79899 Other long term (current) drug therapy: Principal | ICD-10-CM

## 2023-04-07 ENCOUNTER — Ambulatory Visit: Admit: 2023-04-07 | Discharge: 2023-04-07 | Payer: PRIVATE HEALTH INSURANCE

## 2023-04-07 DIAGNOSIS — Z940 Kidney transplant status: Secondary | ICD-10-CM | POA: Diagnosis not present

## 2023-04-07 DIAGNOSIS — Z79899 Other long term (current) drug therapy: Secondary | ICD-10-CM | POA: Diagnosis not present

## 2023-04-07 DIAGNOSIS — Z1159 Encounter for screening for other viral diseases: Secondary | ICD-10-CM | POA: Diagnosis not present

## 2023-04-09 ENCOUNTER — Ambulatory Visit: Admit: 2023-04-09 | Discharge: 2023-04-10 | Payer: PRIVATE HEALTH INSURANCE

## 2023-04-09 ENCOUNTER — Other Ambulatory Visit (HOSPITAL_COMMUNITY): Payer: Self-pay

## 2023-04-09 ENCOUNTER — Other Ambulatory Visit (HOSPITAL_BASED_OUTPATIENT_CLINIC_OR_DEPARTMENT_OTHER): Payer: Self-pay

## 2023-04-09 DIAGNOSIS — B079 Viral wart, unspecified: Secondary | ICD-10-CM | POA: Diagnosis not present

## 2023-04-09 DIAGNOSIS — B360 Pityriasis versicolor: Secondary | ICD-10-CM | POA: Diagnosis not present

## 2023-04-09 DIAGNOSIS — R21 Rash and other nonspecific skin eruption: Principal | ICD-10-CM

## 2023-04-09 MED ORDER — KETOCONAZOLE 2 % EX SHAM
1.0000 | MEDICATED_SHAMPOO | Freq: Every day | CUTANEOUS | 5 refills | Status: AC
  Filled 2023-04-09 (×2): qty 120, 30d supply, fill #0
  Filled 2023-04-09: qty 120, 24d supply, fill #0
  Filled 2023-04-27: qty 120, 15d supply, fill #0

## 2023-04-09 MED ORDER — KETOCONAZOLE 2 % EX CREA
1.0000 | TOPICAL_CREAM | Freq: Two times a day (BID) | CUTANEOUS | 11 refills | Status: AC
  Filled 2023-04-09: qty 30, 15d supply, fill #0
  Filled 2023-04-09: qty 30, 30d supply, fill #0
  Filled 2023-04-09: qty 30, 15d supply, fill #0
  Filled 2023-04-20 – 2023-04-22 (×3): qty 30, 15d supply, fill #1
  Filled 2023-04-27: qty 30, 15d supply, fill #0

## 2023-04-09 MED ORDER — KETOCONAZOLE 2 % SHAMPOO
Freq: Every day | TOPICAL | 5 refills | 0.00000 days | Status: CP
Start: 2023-04-09 — End: 2023-05-09

## 2023-04-09 MED ORDER — SQUARIC ACID 0.5 % IN ACETONE
2 refills | 0.00000 days | Status: CP
Start: 2023-04-09 — End: ?

## 2023-04-09 MED ORDER — KETOCONAZOLE 2 % TOPICAL CREAM
Freq: Two times a day (BID) | TOPICAL | 11 refills | 30.00000 days | Status: CP
Start: 2023-04-09 — End: 2024-04-08

## 2023-04-20 ENCOUNTER — Other Ambulatory Visit (HOSPITAL_COMMUNITY): Payer: Self-pay

## 2023-04-21 DIAGNOSIS — Z1159 Encounter for screening for other viral diseases: Principal | ICD-10-CM

## 2023-04-21 DIAGNOSIS — Z79899 Other long term (current) drug therapy: Principal | ICD-10-CM

## 2023-04-21 DIAGNOSIS — Z940 Kidney transplant status: Principal | ICD-10-CM

## 2023-04-22 ENCOUNTER — Other Ambulatory Visit: Payer: Self-pay

## 2023-04-22 ENCOUNTER — Other Ambulatory Visit (HOSPITAL_BASED_OUTPATIENT_CLINIC_OR_DEPARTMENT_OTHER): Payer: Self-pay

## 2023-04-23 ENCOUNTER — Ambulatory Visit
Admit: 2023-04-23 | Discharge: 2023-04-24 | Payer: PRIVATE HEALTH INSURANCE | Attending: Nephrology | Primary: Nephrology

## 2023-04-23 ENCOUNTER — Other Ambulatory Visit (HOSPITAL_COMMUNITY): Payer: Self-pay

## 2023-04-23 DIAGNOSIS — Z940 Kidney transplant status: Secondary | ICD-10-CM | POA: Diagnosis not present

## 2023-04-23 DIAGNOSIS — D849 Immunodeficiency, unspecified: Secondary | ICD-10-CM | POA: Diagnosis not present

## 2023-04-23 DIAGNOSIS — Z79899 Other long term (current) drug therapy: Secondary | ICD-10-CM | POA: Diagnosis not present

## 2023-04-23 DIAGNOSIS — E038 Other specified hypothyroidism: Secondary | ICD-10-CM | POA: Diagnosis not present

## 2023-04-23 DIAGNOSIS — Z1159 Encounter for screening for other viral diseases: Secondary | ICD-10-CM | POA: Diagnosis not present

## 2023-04-23 DIAGNOSIS — Z48298 Encounter for aftercare following other organ transplant: Secondary | ICD-10-CM | POA: Diagnosis not present

## 2023-04-23 MED ORDER — AMLODIPINE BESYLATE 5 MG PO TABS
5.0000 mg | ORAL_TABLET | Freq: Every day | ORAL | 11 refills | Status: DC
  Filled 2023-04-23: qty 30, 30d supply, fill #0
  Filled 2023-05-19: qty 90, 90d supply, fill #1
  Filled 2023-08-24: qty 90, 90d supply, fill #2
  Filled 2023-11-18: qty 90, 90d supply, fill #3
  Filled 2024-02-23 – 2024-02-24 (×2): qty 60, 60d supply, fill #4

## 2023-04-23 MED ORDER — AMLODIPINE 5 MG TABLET
ORAL_TABLET | Freq: Every day | ORAL | 11 refills | 30 days | Status: CP
Start: 2023-04-23 — End: 2024-04-22

## 2023-04-24 ENCOUNTER — Other Ambulatory Visit: Payer: Self-pay

## 2023-04-27 ENCOUNTER — Other Ambulatory Visit (HOSPITAL_COMMUNITY): Payer: Self-pay

## 2023-04-27 ENCOUNTER — Other Ambulatory Visit: Payer: Self-pay

## 2023-04-28 ENCOUNTER — Other Ambulatory Visit (HOSPITAL_BASED_OUTPATIENT_CLINIC_OR_DEPARTMENT_OTHER): Payer: Self-pay

## 2023-05-01 ENCOUNTER — Ambulatory Visit: Admit: 2023-05-01 | Discharge: 2023-05-02 | Payer: PRIVATE HEALTH INSURANCE

## 2023-05-01 DIAGNOSIS — Z1159 Encounter for screening for other viral diseases: Secondary | ICD-10-CM | POA: Diagnosis not present

## 2023-05-01 DIAGNOSIS — Z79899 Other long term (current) drug therapy: Secondary | ICD-10-CM | POA: Diagnosis not present

## 2023-05-01 DIAGNOSIS — Z940 Kidney transplant status: Secondary | ICD-10-CM | POA: Diagnosis not present

## 2023-05-06 ENCOUNTER — Other Ambulatory Visit (HOSPITAL_COMMUNITY): Payer: Self-pay

## 2023-05-06 ENCOUNTER — Other Ambulatory Visit: Payer: Self-pay

## 2023-05-06 ENCOUNTER — Other Ambulatory Visit: Payer: Self-pay | Admitting: Family Medicine

## 2023-05-06 ENCOUNTER — Other Ambulatory Visit: Payer: Self-pay | Admitting: Psychiatry

## 2023-05-06 MED ORDER — RIZATRIPTAN BENZOATE 10 MG PO TABS
10.0000 mg | ORAL_TABLET | ORAL | 4 refills | Status: DC | PRN
  Filled 2023-05-06 (×2): qty 10, 30d supply, fill #0
  Filled 2023-07-13: qty 10, 30d supply, fill #1
  Filled 2023-09-03 (×2): qty 10, 30d supply, fill #2

## 2023-05-06 NOTE — Telephone Encounter (Signed)
Joshua Atkinson - if this is too much of a request, please disregard! [name] has a severe headache today and ran out of his rizatriptan (and didn't tell me till now - grrr!).  I asked the pharmacy to send it to you for refill - if you can keep an eye out for it, I am going to try to drive to [facility] to pick it up for him this afternoon.    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 :( I'm going to try to get him back in with neuro sooner rather than later, unless that is something you want to manage as PCP?

## 2023-05-12 ENCOUNTER — Other Ambulatory Visit (HOSPITAL_COMMUNITY): Payer: Self-pay

## 2023-05-12 ENCOUNTER — Other Ambulatory Visit: Payer: Self-pay

## 2023-05-15 DIAGNOSIS — E7204 Cystinosis: Principal | ICD-10-CM

## 2023-05-15 MED ORDER — CYSTAGON 150 MG CAPSULE
ORAL_CAPSULE | 7 refills | 0 days | Status: CP
Start: 2023-05-15 — End: ?

## 2023-05-18 DIAGNOSIS — Z1159 Encounter for screening for other viral diseases: Principal | ICD-10-CM

## 2023-05-18 DIAGNOSIS — Z940 Kidney transplant status: Principal | ICD-10-CM

## 2023-05-18 DIAGNOSIS — Z79899 Other long term (current) drug therapy: Principal | ICD-10-CM

## 2023-05-19 ENCOUNTER — Other Ambulatory Visit (HOSPITAL_COMMUNITY): Payer: Self-pay

## 2023-05-20 ENCOUNTER — Other Ambulatory Visit: Payer: Self-pay

## 2023-05-28 ENCOUNTER — Ambulatory Visit: Admit: 2023-05-28 | Discharge: 2023-05-29 | Payer: PRIVATE HEALTH INSURANCE

## 2023-05-28 ENCOUNTER — Ambulatory Visit
Admit: 2023-05-28 | Discharge: 2023-05-29 | Payer: PRIVATE HEALTH INSURANCE | Attending: Nephrology | Primary: Nephrology

## 2023-05-28 DIAGNOSIS — B079 Viral wart, unspecified: Secondary | ICD-10-CM | POA: Diagnosis not present

## 2023-05-28 DIAGNOSIS — L810 Postinflammatory hyperpigmentation: Secondary | ICD-10-CM | POA: Diagnosis not present

## 2023-05-28 DIAGNOSIS — Z79899 Other long term (current) drug therapy: Secondary | ICD-10-CM | POA: Diagnosis not present

## 2023-05-28 DIAGNOSIS — D84821 Immunocompromised state due to drug therapy (CMS-HCC): Principal | ICD-10-CM

## 2023-06-15 DIAGNOSIS — Z940 Kidney transplant status: Principal | ICD-10-CM

## 2023-06-15 DIAGNOSIS — Z1159 Encounter for screening for other viral diseases: Principal | ICD-10-CM

## 2023-06-15 DIAGNOSIS — Z79899 Other long term (current) drug therapy: Principal | ICD-10-CM

## 2023-07-13 ENCOUNTER — Other Ambulatory Visit (HOSPITAL_COMMUNITY): Payer: Self-pay

## 2023-07-13 DIAGNOSIS — Z79899 Other long term (current) drug therapy: Principal | ICD-10-CM

## 2023-07-13 DIAGNOSIS — Z940 Kidney transplant status: Principal | ICD-10-CM

## 2023-07-13 DIAGNOSIS — Z1159 Encounter for screening for other viral diseases: Principal | ICD-10-CM

## 2023-07-16 ENCOUNTER — Ambulatory Visit: Admit: 2023-07-16 | Discharge: 2023-07-17 | Payer: PRIVATE HEALTH INSURANCE

## 2023-07-16 DIAGNOSIS — Z79899 Other long term (current) drug therapy: Secondary | ICD-10-CM | POA: Diagnosis not present

## 2023-07-16 DIAGNOSIS — L810 Postinflammatory hyperpigmentation: Secondary | ICD-10-CM | POA: Diagnosis not present

## 2023-07-16 DIAGNOSIS — B078 Other viral warts: Secondary | ICD-10-CM | POA: Diagnosis not present

## 2023-07-16 DIAGNOSIS — L84 Corns and callosities: Secondary | ICD-10-CM | POA: Diagnosis not present

## 2023-07-16 DIAGNOSIS — Z940 Kidney transplant status: Secondary | ICD-10-CM | POA: Diagnosis not present

## 2023-07-16 DIAGNOSIS — D84821 Immunocompromised state due to drug therapy (CMS-HCC): Principal | ICD-10-CM

## 2023-07-20 ENCOUNTER — Other Ambulatory Visit (HOSPITAL_COMMUNITY): Payer: Self-pay

## 2023-07-20 DIAGNOSIS — Z940 Kidney transplant status: Principal | ICD-10-CM

## 2023-07-20 MED ORDER — MYCOPHENOLATE MOFETIL 250 MG CAPSULE
ORAL_CAPSULE | 3 refills | 0 days
Start: 2023-07-20 — End: ?

## 2023-07-21 MED ORDER — MYCOPHENOLATE MOFETIL 250 MG CAPSULE
ORAL_CAPSULE | 3 refills | 0 days | Status: CP
Start: 2023-07-21 — End: ?

## 2023-07-22 ENCOUNTER — Other Ambulatory Visit (HOSPITAL_COMMUNITY): Payer: Self-pay

## 2023-07-22 ENCOUNTER — Other Ambulatory Visit: Payer: Self-pay

## 2023-07-22 MED ORDER — MYCOPHENOLATE MOFETIL 250 MG PO CAPS
500.0000 mg | ORAL_CAPSULE | Freq: Two times a day (BID) | ORAL | 3 refills | Status: DC
  Filled 2023-07-22: qty 360, 90d supply, fill #0
  Filled 2023-10-26: qty 360, 90d supply, fill #1
  Filled 2024-01-19: qty 360, 90d supply, fill #2
  Filled 2024-04-06 (×2): qty 360, 90d supply, fill #3

## 2023-07-28 ENCOUNTER — Other Ambulatory Visit (HOSPITAL_COMMUNITY): Payer: Self-pay

## 2023-08-10 DIAGNOSIS — Z1159 Encounter for screening for other viral diseases: Principal | ICD-10-CM

## 2023-08-10 DIAGNOSIS — Z940 Kidney transplant status: Principal | ICD-10-CM

## 2023-08-10 DIAGNOSIS — Z79899 Other long term (current) drug therapy: Principal | ICD-10-CM

## 2023-08-23 DIAGNOSIS — Z79899 Other long term (current) drug therapy: Principal | ICD-10-CM

## 2023-08-23 DIAGNOSIS — D631 Anemia in chronic kidney disease: Principal | ICD-10-CM

## 2023-08-23 DIAGNOSIS — N189 Chronic kidney disease, unspecified: Principal | ICD-10-CM

## 2023-08-23 DIAGNOSIS — Z940 Kidney transplant status: Principal | ICD-10-CM

## 2023-08-23 DIAGNOSIS — Z1159 Encounter for screening for other viral diseases: Principal | ICD-10-CM

## 2023-08-24 ENCOUNTER — Other Ambulatory Visit (HOSPITAL_COMMUNITY): Payer: Self-pay

## 2023-08-28 ENCOUNTER — Ambulatory Visit: Admit: 2023-08-28 | Discharge: 2023-08-28 | Payer: PRIVATE HEALTH INSURANCE

## 2023-08-28 ENCOUNTER — Ambulatory Visit
Admit: 2023-08-28 | Discharge: 2023-08-28 | Payer: PRIVATE HEALTH INSURANCE | Attending: Nephrology | Primary: Nephrology

## 2023-08-28 DIAGNOSIS — D849 Immunodeficiency, unspecified: Secondary | ICD-10-CM | POA: Diagnosis not present

## 2023-08-28 DIAGNOSIS — Z940 Kidney transplant status: Secondary | ICD-10-CM | POA: Diagnosis not present

## 2023-08-28 DIAGNOSIS — E7204 Cystinosis: Secondary | ICD-10-CM | POA: Diagnosis not present

## 2023-08-28 DIAGNOSIS — E039 Hypothyroidism, unspecified: Secondary | ICD-10-CM | POA: Diagnosis not present

## 2023-08-28 DIAGNOSIS — D631 Anemia in chronic kidney disease: Secondary | ICD-10-CM | POA: Diagnosis not present

## 2023-08-28 DIAGNOSIS — Z1159 Encounter for screening for other viral diseases: Secondary | ICD-10-CM | POA: Diagnosis not present

## 2023-08-28 DIAGNOSIS — N189 Chronic kidney disease, unspecified: Secondary | ICD-10-CM | POA: Diagnosis not present

## 2023-08-28 DIAGNOSIS — Z79899 Other long term (current) drug therapy: Secondary | ICD-10-CM | POA: Diagnosis not present

## 2023-08-28 DIAGNOSIS — Z48298 Encounter for aftercare following other organ transplant: Secondary | ICD-10-CM | POA: Diagnosis not present

## 2023-09-03 ENCOUNTER — Other Ambulatory Visit (HOSPITAL_COMMUNITY): Payer: Self-pay

## 2023-09-07 DIAGNOSIS — Z940 Kidney transplant status: Principal | ICD-10-CM

## 2023-09-07 DIAGNOSIS — Z1159 Encounter for screening for other viral diseases: Principal | ICD-10-CM

## 2023-09-07 DIAGNOSIS — Z79899 Other long term (current) drug therapy: Principal | ICD-10-CM

## 2023-09-14 ENCOUNTER — Other Ambulatory Visit (HOSPITAL_COMMUNITY): Payer: Self-pay

## 2023-09-15 DIAGNOSIS — Z940 Kidney transplant status: Principal | ICD-10-CM

## 2023-09-17 ENCOUNTER — Ambulatory Visit: Admit: 2023-09-17 | Discharge: 2023-09-18 | Payer: PRIVATE HEALTH INSURANCE

## 2023-09-17 DIAGNOSIS — B079 Viral wart, unspecified: Secondary | ICD-10-CM | POA: Diagnosis not present

## 2023-09-17 DIAGNOSIS — Z79899 Other long term (current) drug therapy: Principal | ICD-10-CM

## 2023-09-17 DIAGNOSIS — Z940 Kidney transplant status: Principal | ICD-10-CM

## 2023-09-17 DIAGNOSIS — G935 Compression of brain: Principal | ICD-10-CM

## 2023-09-17 DIAGNOSIS — D84821 Immunocompromised state due to drug therapy (CMS-HCC): Principal | ICD-10-CM

## 2023-09-17 DIAGNOSIS — B078 Other viral warts: Principal | ICD-10-CM

## 2023-10-01 ENCOUNTER — Ambulatory Visit: Admit: 2023-10-01 | Discharge: 2023-10-01 | Payer: PRIVATE HEALTH INSURANCE

## 2023-10-01 ENCOUNTER — Ambulatory Visit: Payer: 59 | Admitting: Neurology

## 2023-10-01 ENCOUNTER — Encounter: Payer: Self-pay | Admitting: Neurology

## 2023-10-01 ENCOUNTER — Other Ambulatory Visit (HOSPITAL_COMMUNITY): Payer: Self-pay

## 2023-10-01 VITALS — BP 132/80 | Ht 68.0 in | Wt 142.0 lb

## 2023-10-01 DIAGNOSIS — G43009 Migraine without aura, not intractable, without status migrainosus: Secondary | ICD-10-CM

## 2023-10-01 DIAGNOSIS — G935 Compression of brain: Secondary | ICD-10-CM | POA: Diagnosis not present

## 2023-10-01 DIAGNOSIS — Z940 Kidney transplant status: Principal | ICD-10-CM

## 2023-10-01 MED ORDER — TIZANIDINE HCL 4 MG PO TABS
4.0000 mg | ORAL_TABLET | Freq: Four times a day (QID) | ORAL | 6 refills | Status: AC | PRN
  Filled 2023-10-01: qty 30, 8d supply, fill #0
  Filled 2024-04-26: qty 30, 8d supply, fill #1

## 2023-10-01 MED ORDER — ONDANSETRON 4 MG PO TBDP
4.0000 mg | ORAL_TABLET | Freq: Three times a day (TID) | ORAL | 6 refills | Status: AC | PRN
  Filled 2023-10-01: qty 20, 7d supply, fill #0

## 2023-10-01 MED ORDER — SUMATRIPTAN SUCCINATE 100 MG PO TABS
100.0000 mg | ORAL_TABLET | ORAL | 6 refills | Status: DC | PRN
  Filled 2023-10-01: qty 12, 30d supply, fill #0
  Filled 2023-11-18: qty 12, 30d supply, fill #1
  Filled 2024-02-01: qty 12, 30d supply, fill #2
  Filled 2024-04-06: qty 12, 30d supply, fill #3

## 2023-10-01 MED ORDER — PROPRANOLOL HCL ER 60 MG PO CP24
60.0000 mg | ORAL_CAPSULE | Freq: Every day | ORAL | 11 refills | Status: DC
  Filled 2023-10-01: qty 30, 30d supply, fill #0
  Filled 2023-10-30: qty 30, 30d supply, fill #1
  Filled 2023-12-07: qty 30, 30d supply, fill #2
  Filled 2024-01-05 – 2024-01-06 (×2): qty 30, 30d supply, fill #3
  Filled 2024-02-01: qty 30, 30d supply, fill #4

## 2023-10-01 NOTE — Progress Notes (Signed)
Chief Complaint  Patient presents with   Follow-up    Rm 15, headaches and chiari malformatin, reprots worsening headaches, 4-5 headache days monthly, some relief with rizatriptan      ASSESSMENT AND PLAN  Joshua Atkinson is a 32 y.o. male   Chronic migraine headache  Add Inderal LA 60 mg daily as migraine prevention  Try Imitrex 100 mg as needed, may combine with tizanidine, Tylenol, Zofran as needed for prolonged severe headaches,  History of infantile cystinosis, kidney transplant, Arnold-Chiari malformation, recent worsening headaches, worry about structural abnormality, repeat MRI of the brain without contrast, he has appointment with [facility] neurosurgeon  Return To Clinic With NP In 6 Months virtual visit  DIAGNOSTIC DATA (LABS, IMAGING, TESTING) - I reviewed patient records, labs, notes, testing and imaging myself where available.   MEDICAL HISTORY:  Joshua Atkinson is a 32 year old male, accompanied by his wife, who is oncology PA at [facility] [employer], seen in request by his primary care from [facility], Dr. Gerda Diss, Lorin Picket A to follow-up on migraine headaches, he was seen by Dr. Delena Bali once in May 2023,  History is obtained from the patient and review of electronic medical records. I personally reviewed pertinent available imaging films in PACS.   PMHx of  HTN Hypothyrodism Kidney transplant in 2017, 2021, infantile cystinosis,   He has history of infantile cystinosis, kidney transplant twice, most recent 26 September 2020, taking CellCept, prednisone, followed by by [facility] nephrology  He has lifelong history of headache, getting worse since 2022, retro-orbital area headache sometimes occipital region, with light, noise sensitivity, occasionally nauseous, can lasting for hours, or even longer  [facility] the [facility] in 2021 --No acute intracranial abnormality. No intracranial lesions are identified.   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 No evidence of syrinx in the visualized portions of the upper cervical spine.  He was seen by Dr. Delena Bali, in May 2023, given the prescription of Maxalt, works well most of the time, he is now having average 1 migraine each week, sometimes can be so severe, extending to his neck, failed response to Maxalt, given his severe headache, he can also have dizziness, unsteady gait, but no persistent motor or sensory deficit, no bowel and bladder incontinence  Personally reviewed MRI of cervical spine February 2023, 5 mm downward cerebellar tonsillar ectopia, no syringomyelia, normal MRI thoracic spine, and the lumbar spine  PHYSICAL EXAM:   Vitals:   10/01/23 1351  BP: 132/80  Weight: 142 lb (64.4 kg)  Height: 5\' 8"  (1.727 m)   Not recorded     Body mass index is 21.59 kg/m.  PHYSICAL EXAMNIATION:  Gen: NAD, conversant, well nourised, well groomed                     Cardiovascular: Regular rate rhythm, no peripheral edema, warm, nontender. Eyes: Conjunctivae clear without exudates or hemorrhage Neck: Supple, no carotid bruits. Pulmonary: Clear to auscultation bilaterally   NEUROLOGICAL EXAM:  MENTAL STATUS: Speech/cognition: Awake, alert, oriented to history taking and casual conversation CRANIAL NERVES: CN II: Visual fields are full to confrontation. Pupils are round equal and briskly reactive to light.  Funduscopy examination showed sharp disc bilaterally CN III, IV, VI: extraocular movement are normal. No ptosis. CN V: Facial sensation is intact to light touch CN VII: Face is symmetric with normal eye closure  CN VIII: Hearing is normal to  causal conversation. CN IX, X: Phonation is normal. CN XI: Head turning and shoulder shrug are intact  MOTOR: There is no pronator drift of out-stretched arms. Muscle bulk and tone are normal. Muscle strength is normal.  REFLEXES: Reflexes are 2+ and symmetric at the biceps,  triceps, knees, and ankles. Plantar responses are flexor.  SENSORY: Intact to light touch, pinprick and vibratory sensation are intact in fingers and toes.  COORDINATION: There is no trunk or limb dysmetria noted.  GAIT/STANCE: Posture is normal. Gait is steady with normal steps, base, arm swing, and turning. Heel and toe walking are normal. Tandem gait is normal.  Romberg is absent.  REVIEW OF SYSTEMS:  Full 14 system review of systems performed and notable only for as above All other review of systems were negative.   ALLERGIES: Allergies  Allergen Reactions   Sulfa Antibiotics Hives    Other reaction(s): SWELLING/EDEMA Other reaction(s): SWELLING/EDEMA    Advil [Ibuprofen]     Kidney problems   Dapsone    Septra [Sulfamethoxazole-Trimethoprim]     Nephrotic Syndrome    HOME MEDICATIONS: Current Outpatient Medications  Medication Sig Dispense Refill   amLODipine (NORVASC) 5 MG tablet Take 1 tablet (5 mg total) by mouth daily. 30 tablet 11   Cholecalciferol 50 MCG (2000 UT) TABS Take by mouth.     CYSTAGON 150 MG CAPS Take 450 capsules by mouth in the morning, at noon, in the evening, and at bedtime.     ketoconazole (NIZORAL) 2 % cream Apply to rash twice daily until your follow up appointment 30 g 11   ketoconazole (NIZORAL) 2 % shampoo Apply topically daily. Use in the shower to wash areas with rash. Leave on for as long as possible (2-5 minutes) before rinsing 120 mL 5   levothyroxine (SYNTHROID) 50 MCG tablet Take 1 tablet by mouth nightly. 30 tablet 11   mycophenolate (CELLCEPT) 250 MG capsule Take 2 capsules (500 mg total) by mouth 2 (two) times daily. 360 capsule 3   predniSONE (DELTASONE) 5 MG tablet Take 1 tablet (5 mg total) by mouth daily. 90 tablet 3   rizatriptan (MAXALT) 10 MG tablet Take 1 tablet (10 mg total) by mouth as needed for migraine. May repeat in 2 hours if needed 10 tablet 4   sodium bicarbonate 650 MG tablet Take 2 tablets (1,300 mg total) by  mouth 2 (two) times daily. 360 tablet 3   Specialty Vitamins Products (MG PLUS PROTEIN) 133 MG TABS Take 1 tablet by mouth 2 times a day. 60 tablet 11   tacrolimus (PROGRAF) 1 MG capsule Take 3 capsules (3 mg total) by mouth 2 (two) times daily. (Patient taking differently: Take 3 mg by mouth at bedtime.) 540 capsule 3   tacrolimus (PROGRAF) 1 MG capsule Take 4 capsules (4 mg total) by mouth daily AND 3 capsules (3 mg total) Nightly. 630 capsule 3   benzonatate (TESSALON PERLES) 100 MG capsule Take 1 capsule (100 mg total) by mouth 3 (three) times daily as needed. (Patient not taking: Reported on 10/01/2023) 20 capsule 0   Specialty Vitamins Products (MAGNESIUM, AMINO ACID CHELATE,) 133 MG tablet Take 1 tablet by mouth 2 times a day. (Patient not taking: Reported on 10/01/2023) 60 tablet 11   Squaric Acid Dibutylester LIQD by Does not apply route. (Patient not taking: Reported on 10/01/2023)     valACYclovir (VALTREX) 500 MG tablet Take 1 tablet (500 mg total) by mouth in the morning. (Patient not taking: Reported on 11/12/2022) 90  tablet 2   No current facility-administered medications for this visit.    PAST MEDICAL HISTORY: Past Medical History:  Diagnosis Date   Allergy    Chiari I malformation (HCC)    Chronic kidney disease    COVID 08/2019   Cystinosis (HCC)    Generalized headaches    History of chicken pox    Hypertension    Hypothyroidism    Kidney transplanted    VZV (varicella-zoster virus) infection    hx of   Warts     PAST SURGICAL HISTORY: Past Surgical History:  Procedure Laterality Date   KIDNEY TRANSPLANT  2017   again in 09/2020   MYRINGOTOMY WITH TUBE PLACEMENT Bilateral    multiple as a child   RENAL BIOPSY  10/27/1994   Nephrotic Syndrome    FAMILY HISTORY: Family History  Problem Relation Age of Onset   Arthritis Mother    Cancer Mother        breast cancer   Arthritis Father    Hypertension Father    Arthritis Maternal Grandmother    Hypertension  Paternal Grandmother    Hypertension Paternal Grandfather    Diabetes Paternal Grandfather     SOCIAL HISTORY: Social History   Socioeconomic History   Marital status: Married    Spouse name: Not on file   Number of children: 0   Years of education: Not on file   Highest education level: Not on file  Occupational History   Not on file  Tobacco Use   Smoking status: Never   Smokeless tobacco: Never  Substance and Sexual Activity   Alcohol use: No    Alcohol/week: 0.0 standard drinks of alcohol   Drug use: No   Sexual activity: Not on file  Other Topics Concern   Not on file  Social History Narrative   Right handed   Caffeine-1 cup daily   Married, lives with wife   Works as Curator   Social Determinants of Corporate investment banker Strain: Not on file  Food Insecurity: No Food Insecurity (10/02/2020)   [facility], [facility]   Hunger Vital Sign    Worried About Running Out of Food in the Last Year: Never true    Ran Out of Food in the Last Year: Never true  Transportation Needs: Not on file  Physical Activity: Not on file  Stress: Not on file  Social Connections: Not on file  Intimate Partner Violence: Not on file      Levert Feinstein, M.D. Ph.D.  [facility] [address] [place], [place] [address] Ph: [phone] Fax: [phone]  CC:  Babs Sciara, MD [address] [place],  [place] [address]  Babs Sciara, MD

## 2023-10-01 NOTE — Patient Instructions (Signed)
Meds ordered this encounter  Medications   tiZANidine (ZANAFLEX) 4 MG tablet    Sig: Take 1 tablet (4 mg total) by mouth every 6 (six) hours as needed for muscle spasms.    Dispense:  30 tablet    Refill:  6   ondansetron (ZOFRAN-ODT) 4 MG disintegrating tablet    Sig: Take 1 tablet (4 mg total) by mouth every 8 (eight) hours as needed for nausea or vomiting.    Dispense:  20 tablet    Refill:  6   SUMAtriptan (IMITREX) 100 MG tablet    Sig: Take 1 tablet (100 mg total) by mouth every 2 (two) hours as needed for migraine. May repeat in 2 hours if headache persists or recurs.    Dispense:  12 tablet    Refill:  6   propranolol ER (INDERAL LA) 60 MG 24 hr capsule    Sig: Take 1 capsule (60 mg total) by mouth daily.    Dispense:  30 capsule    Refill:  11

## 2023-10-05 ENCOUNTER — Other Ambulatory Visit (HOSPITAL_COMMUNITY): Payer: Self-pay

## 2023-10-05 DIAGNOSIS — Z940 Kidney transplant status: Principal | ICD-10-CM

## 2023-10-05 DIAGNOSIS — Z79899 Other long term (current) drug therapy: Principal | ICD-10-CM

## 2023-10-05 DIAGNOSIS — Z1159 Encounter for screening for other viral diseases: Principal | ICD-10-CM

## 2023-10-06 ENCOUNTER — Ambulatory Visit (INDEPENDENT_AMBULATORY_CARE_PROVIDER_SITE_OTHER): Payer: 59

## 2023-10-06 ENCOUNTER — Other Ambulatory Visit: Payer: Self-pay

## 2023-10-06 DIAGNOSIS — G935 Compression of brain: Secondary | ICD-10-CM | POA: Diagnosis not present

## 2023-10-06 DIAGNOSIS — G43009 Migraine without aura, not intractable, without status migrainosus: Secondary | ICD-10-CM | POA: Diagnosis not present

## 2023-10-07 DIAGNOSIS — E7204 Cystinosis: Secondary | ICD-10-CM | POA: Diagnosis not present

## 2023-10-23 ENCOUNTER — Other Ambulatory Visit (HOSPITAL_COMMUNITY): Payer: Self-pay

## 2023-10-29 DIAGNOSIS — Z940 Kidney transplant status: Principal | ICD-10-CM

## 2023-10-30 ENCOUNTER — Other Ambulatory Visit (HOSPITAL_BASED_OUTPATIENT_CLINIC_OR_DEPARTMENT_OTHER): Payer: Self-pay

## 2023-10-30 ENCOUNTER — Other Ambulatory Visit (HOSPITAL_COMMUNITY): Payer: Self-pay

## 2023-10-30 MED ORDER — LEVOTHYROXINE SODIUM 50 MCG PO TABS
50.0000 ug | ORAL_TABLET | Freq: Every day | ORAL | 11 refills | Status: AC
  Filled 2023-10-30 – 2024-01-05 (×4): qty 30, 30d supply, fill #0
  Filled 2024-01-06: qty 90, 90d supply, fill #0
  Filled 2024-04-06 (×2): qty 90, 90d supply, fill #1
  Filled 2024-07-12: qty 90, 90d supply, fill #2

## 2023-10-30 MED ORDER — LEVOTHYROXINE 50 MCG TABLET
ORAL_TABLET | Freq: Every evening | ORAL | 11 refills | 30.00 days | Status: CP
Start: 2023-10-30 — End: ?

## 2023-11-02 DIAGNOSIS — Z1159 Encounter for screening for other viral diseases: Principal | ICD-10-CM

## 2023-11-02 DIAGNOSIS — Z79899 Other long term (current) drug therapy: Principal | ICD-10-CM

## 2023-11-02 DIAGNOSIS — Z940 Kidney transplant status: Principal | ICD-10-CM

## 2023-11-05 ENCOUNTER — Other Ambulatory Visit (HOSPITAL_COMMUNITY): Payer: Self-pay

## 2023-11-09 ENCOUNTER — Ambulatory Visit: Admit: 2023-11-09 | Discharge: 2023-11-10 | Payer: PRIVATE HEALTH INSURANCE

## 2023-11-09 DIAGNOSIS — G935 Compression of brain: Secondary | ICD-10-CM | POA: Diagnosis not present

## 2023-11-18 ENCOUNTER — Other Ambulatory Visit (HOSPITAL_COMMUNITY): Payer: Self-pay

## 2023-11-24 ENCOUNTER — Ambulatory Visit (INDEPENDENT_AMBULATORY_CARE_PROVIDER_SITE_OTHER): Payer: 59 | Admitting: Family Medicine

## 2023-11-24 VITALS — BP 114/72 | HR 74 | Temp 99.0°F | Ht 68.0 in | Wt 146.2 lb

## 2023-11-24 DIAGNOSIS — E7204 Cystinosis: Secondary | ICD-10-CM | POA: Diagnosis not present

## 2023-11-24 DIAGNOSIS — Z0001 Encounter for general adult medical examination with abnormal findings: Secondary | ICD-10-CM

## 2023-11-24 DIAGNOSIS — Z0000 Encounter for general adult medical examination without abnormal findings: Secondary | ICD-10-CM
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
## 2023-11-26 ENCOUNTER — Ambulatory Visit: Admit: 2023-11-26 | Discharge: 2023-11-26 | Payer: PRIVATE HEALTH INSURANCE

## 2023-11-26 ENCOUNTER — Inpatient Hospital Stay: Admit: 2023-11-26 | Discharge: 2023-11-26 | Payer: PRIVATE HEALTH INSURANCE

## 2023-11-26 DIAGNOSIS — Z79899 Other long term (current) drug therapy: Secondary | ICD-10-CM | POA: Diagnosis not present

## 2023-11-26 DIAGNOSIS — B079 Viral wart, unspecified: Secondary | ICD-10-CM | POA: Diagnosis not present

## 2023-11-26 DIAGNOSIS — D84821 Immunocompromised state due to drug therapy (CMS-HCC): Principal | ICD-10-CM

## 2023-11-26 DIAGNOSIS — Z4822 Encounter for aftercare following kidney transplant: Secondary | ICD-10-CM | POA: Diagnosis not present

## 2023-11-26 DIAGNOSIS — Z940 Kidney transplant status: Secondary | ICD-10-CM | POA: Diagnosis not present

## 2023-11-30 DIAGNOSIS — Z79899 Other long term (current) drug therapy: Principal | ICD-10-CM

## 2023-11-30 DIAGNOSIS — Z1159 Encounter for screening for other viral diseases: Principal | ICD-10-CM

## 2023-11-30 DIAGNOSIS — Z940 Kidney transplant status: Principal | ICD-10-CM

## 2023-12-07 ENCOUNTER — Other Ambulatory Visit (HOSPITAL_COMMUNITY): Payer: Self-pay

## 2023-12-28 DIAGNOSIS — Z1159 Encounter for screening for other viral diseases: Principal | ICD-10-CM

## 2023-12-28 DIAGNOSIS — Z79899 Other long term (current) drug therapy: Principal | ICD-10-CM

## 2023-12-28 DIAGNOSIS — Z940 Kidney transplant status: Principal | ICD-10-CM

## 2024-01-04 DIAGNOSIS — E7204 Cystinosis: Principal | ICD-10-CM

## 2024-01-04 MED ORDER — CYSTAGON 150 MG CAPSULE
ORAL_CAPSULE | 7 refills | 0.00 days | Status: CP
Start: 2024-01-04 — End: ?

## 2024-01-05 ENCOUNTER — Other Ambulatory Visit (HOSPITAL_COMMUNITY): Payer: Self-pay

## 2024-01-06 ENCOUNTER — Other Ambulatory Visit: Payer: Self-pay

## 2024-01-06 ENCOUNTER — Other Ambulatory Visit (HOSPITAL_COMMUNITY): Payer: Self-pay

## 2024-01-11 ENCOUNTER — Ambulatory Visit
Admission: EM | Admit: 2024-01-11 | Discharge: 2024-01-11 | Disposition: A | Discharge status: Home / Self Care | Attending: Emergency Medicine | Admitting: Emergency Medicine

## 2024-01-11 DIAGNOSIS — B349 Viral infection, unspecified: Secondary | ICD-10-CM

## 2024-01-11 LAB — POC COVID19/FLU A&B COMBO
Covid Antigen, POC: NEGATIVE
Influenza A Antigen, POC: NEGATIVE
Influenza B Antigen, POC: NEGATIVE

## 2024-01-11 NOTE — Discharge Instructions (Addendum)
 The COVID and flu tests are negative.   Take Tylenol as needed for fever or discomfort.      Follow-up with your primary care provider if your symptoms are not improving.

## 2024-01-11 NOTE — ED Provider Notes (Signed)
 [facility]    CSN: [id number] Arrival date & time: 01/11/24  1012      History   Chief Complaint Chief Complaint  Patient presents with   Fever    HPI Joshua Atkinson is a 33 y.o. male.  Patient presents with fever and bodyaches since last night.  Tylenol taken this morning.  No ear pain, sore throat, cough, vomiting, diarrhea.  Patient's medical history includes kidney transplant x 2.  The history is provided by the patient and medical records.    Past Medical History:  Diagnosis Date   Allergy    Chiari I malformation (HCC)    Chronic kidney disease    COVID 08/2019   Cystinosis (HCC)    Generalized headaches    History of chicken pox    Hypertension    Hypothyroidism    Kidney transplanted    VZV (varicella-zoster virus) infection    hx of   Warts     Patient Active Problem List   Diagnosis Date Noted   Chiari malformation type I (HCC) 10/01/2023   Migraine without aura and without status migrainosus, not intractable 10/01/2023   Influenza A 12/05/2018   Kidney replaced by transplant 08/05/2016   URI (upper respiratory infection) 02/26/2016   Fever, unspecified 02/26/2016   Cystinosis (HCC) 03/06/2013   Chronic kidney disease 03/06/2013   Environmental allergies 03/06/2013    Past Surgical History:  Procedure Laterality Date   KIDNEY TRANSPLANT  2017   again in 09/2020   MYRINGOTOMY WITH TUBE PLACEMENT Bilateral    multiple as a child   RENAL BIOPSY  10/27/1994   Nephrotic Syndrome       Home Medications    Prior to Admission medications   Medication Sig Start Date End Date Taking? Authorizing Provider  amLODipine (NORVASC) 5 MG tablet Take 1 tablet (5 mg total) by mouth daily. 04/23/23     benzonatate (TESSALON PERLES) 100 MG capsule Take 1 capsule (100 mg total) by mouth 3 (three) times daily as needed. Patient not taking: Reported on 10/01/2023 01/31/23   Bennie Pierini, FNP  Cholecalciferol 50 MCG (2000 UT) TABS Take by  mouth. 03/22/21   [provider]  CYSTAGON 150 MG CAPS Take 450 capsules by mouth in the morning, at noon, in the evening, and at bedtime. 01/13/22   [provider]  ketoconazole (NIZORAL) 2 % cream Apply to rash twice daily until your follow up appointment Patient not taking: Reported on 11/24/2023 04/09/23     ketoconazole (NIZORAL) 2 % shampoo Apply topically daily. Use in the shower to wash areas with rash. Leave on for as long as possible (2-5 minutes) before rinsing Patient not taking: Reported on 11/24/2023 04/09/23     levothyroxine (SYNTHROID) 50 MCG tablet Take 1 tablet (50 mcg total) by mouth at nightly 10/30/23     mycophenolate (CELLCEPT) 250 MG capsule Take 2 capsules (500 mg total) by mouth 2 (two) times daily. 07/21/23     ondansetron (ZOFRAN-ODT) 4 MG disintegrating tablet Dissolve 1 tablet (4 mg total) by mouth every 8 (eight) hours as needed for nausea or vomiting. 10/01/23   Levert Feinstein, MD  predniSONE (DELTASONE) 5 MG tablet Take 1 tablet (5 mg total) by mouth daily. 03/20/23     propranolol ER (INDERAL LA) 60 MG 24 hr capsule Take 1 capsule (60 mg total) by mouth daily. 10/01/23   Levert Feinstein, MD  sodium bicarbonate 650 MG tablet Take 2 tablets (1,300 mg total) by mouth  2 (two) times daily. 12/04/22     Specialty Vitamins Products (MAGNESIUM, AMINO ACID CHELATE,) 133 MG tablet Take 1 tablet by mouth 2 times a day. Patient not taking: Reported on 10/01/2023 12/03/21     Specialty Vitamins Products (MG PLUS PROTEIN) 133 MG TABS Take 1 tablet by mouth 2 times a day. 10/13/21     Squaric Acid Dibutylester LIQD by Does not apply route. Patient not taking: Reported on 10/01/2023    [provider]  SUMAtriptan (IMITREX) 100 MG tablet Take 1 tablet (100 mg total) by mouth every 2 (two) hours as needed for migraine. May repeat in 2 hours if headache persists or recurs. 10/01/23   Levert Feinstein, MD  tacrolimus (PROGRAF) 1 MG capsule Take 3 capsules (3 mg total) by mouth 2 (two)  times daily. Patient taking differently: Take 3 mg by mouth at bedtime. 01/21/23     tacrolimus (PROGRAF) 1 MG capsule Take 4 capsules (4 mg total) by mouth daily AND 3 capsules (3 mg total) Nightly. 01/28/23     tiZANidine (ZANAFLEX) 4 MG tablet Take 1 tablet (4 mg total) by mouth every 6 (six) hours as needed for muscle spasms. 10/01/23   Levert Feinstein, MD  valACYclovir (VALTREX) 500 MG tablet Take 1 tablet (500 mg total) by mouth in the morning. Patient not taking: Reported on 11/24/2023 04/05/21       Family History Family History  Problem Relation Age of Onset   Arthritis Mother    Cancer Mother        breast cancer   Arthritis Father    Hypertension Father    Arthritis Maternal Grandmother    Hypertension Paternal Grandmother    Hypertension Paternal Grandfather    Diabetes Paternal Grandfather     Social History Social History   Tobacco Use   Smoking status: Never   Smokeless tobacco: Never  Substance Use Topics   Alcohol use: No    Alcohol/week: 0.0 standard drinks of alcohol   Drug use: No     Allergies   Sulfa antibiotics, Advil [ibuprofen], Dapsone, and Septra [sulfamethoxazole-trimethoprim]   Review of Systems Review of Systems  Constitutional:  Positive for fever. Negative for chills.  HENT:  Negative for ear pain and sore throat.   Respiratory:  Negative for cough and shortness of breath.   Gastrointestinal:  Negative for diarrhea and vomiting.     Physical Exam Triage Vital Signs ED Triage Vitals  Encounter Vitals Group     BP 01/11/24 1146 115/77     Systolic BP Percentile --      Diastolic BP Percentile --      Pulse Rate 01/11/24 1146 86     Resp 01/11/24 1146 18     Temp 01/11/24 1146 97.8 F (36.6 C)     Temp src --      SpO2 01/11/24 1146 98 %     Weight --      Height --      Head Circumference --      Peak Flow --      Pain Score 01/11/24 1149 7     Pain Loc --      Pain Education --      Exclude from Growth Chart --    No data  found.  Updated Vital Signs BP 115/77   Pulse 86   Temp 97.8 F (36.6 C)   Resp 18   SpO2 98%   Visual Acuity Right Eye Distance:   Left  Eye Distance:   Bilateral Distance:    Right Eye Near:   Left Eye Near:    Bilateral Near:     Physical Exam Constitutional:      General: He is not in acute distress. HENT:     Right Ear: Tympanic membrane normal.     Left Ear: Tympanic membrane normal.     Nose: Nose normal.     Mouth/Throat:     Mouth: Mucous membranes are moist.     Pharynx: Oropharynx is clear.  Cardiovascular:     Rate and Rhythm: Normal rate and regular rhythm.     Heart sounds: Normal heart sounds.  Pulmonary:     Effort: Pulmonary effort is normal. No respiratory distress.     Breath sounds: Normal breath sounds.  Neurological:     Mental Status: He is alert.      UC Treatments / Results  Labs (all labs ordered are listed, but only abnormal results are displayed) Labs Reviewed  POC COVID19/FLU A&B COMBO    EKG   Radiology No results found.  Procedures Procedures (including critical care time)  Medications Ordered in UC Medications - No data to display  Initial Impression / Assessment and Plan / UC Course  I have reviewed the triage vital signs and the nursing notes.  Pertinent labs & imaging results that were available during my care of the patient were reviewed by me and considered in my medical decision making (see chart for details).    Viral illness.  Rapid COVID and flu negative.  Discussed symptomatic treatment including Tylenol as needed, rest, hydration.  Instructed patient to follow-up with his PCP if not improving.  ED precautions given.  Patient agrees to plan of care.  Final Clinical Impressions(s) / UC Diagnoses   Final diagnoses:  Viral illness     Discharge Instructions      The COVID and flu tests are negative.   Take Tylenol as needed for fever or discomfort.      Follow-up with your primary care provider if  your symptoms are not improving.         ED Prescriptions   None    PDMP not reviewed this encounter.   Mickie Bail, NP 01/11/24 [phone]

## 2024-01-11 NOTE — ED Triage Notes (Signed)
 Patient to Urgent Care with complaints of fevers/ body aches.  Symptoms started last night.   Meds: tylenol this morning.

## 2024-01-19 ENCOUNTER — Other Ambulatory Visit (HOSPITAL_COMMUNITY): Payer: Self-pay

## 2024-01-23 ENCOUNTER — Telehealth: Admitting: Physician Assistant

## 2024-01-23 DIAGNOSIS — J0110 Acute frontal sinusitis, unspecified: Secondary | ICD-10-CM

## 2024-01-23 MED ORDER — AMOXICILLIN-POT CLAVULANATE 875-125 MG PO TABS
1.0000 | ORAL_TABLET | Freq: Two times a day (BID) | ORAL | 0 refills | Status: DC
Start: 2024-01-23 — End: 2024-04-19

## 2024-01-23 MED ORDER — FLUTICASONE PROPIONATE 50 MCG/ACT NA SUSP: 2.0000 | Freq: Every day | NASAL | 0 refills | Status: AC

## 2024-01-23 NOTE — Progress Notes (Signed)
 Virtual Visit Consent   Joshua Atkinson, you are scheduled for a virtual visit with a [facility] provider today. Just as with appointments in the office, your consent must be obtained to participate. Your consent will be active for this visit and any virtual visit you may have with one of our providers in the next 365 days. If you have a MyChart account, a copy of this consent can be sent to you electronically.  As this is a virtual visit, video technology does not allow for your provider to perform a traditional examination. This may limit your provider's ability to fully assess your condition. If your provider identifies any concerns that need to be evaluated in person or the need to arrange testing (such as labs, EKG, etc.), we will make arrangements to do so. Although advances in technology are sophisticated, we cannot ensure that it will always work on either your end or our end. If the connection with a video visit is poor, the visit may have to be switched to a telephone visit. With either a video or telephone visit, we are not always able to ensure that we have a secure connection.  By engaging in this virtual visit, you consent to the provision of healthcare and authorize for your insurance to be billed (if applicable) for the services provided during this visit. Depending on your insurance coverage, you may receive a charge related to this service.  I need to obtain your verbal consent now. Are you willing to proceed with your visit today? Joshua Atkinson has provided verbal consent on 01/23/2024 for a virtual visit (video or telephone). Joshua Jaffe, PA-C  Date: 01/23/2024 4:39 PM   Virtual Visit via Video Note   I, Joshua Atkinson, connected with  Joshua Atkinson  ([id number], Nov 11, 1990) on 01/23/24 at  4:30 PM EDT by a video-enabled telemedicine application and verified that I am speaking with the correct person using two identifiers.  Location: Patient: Virtual Visit Location  Patient: Home Provider: Virtual Visit Location Provider: Home Office   I discussed the limitations of evaluation and management by telemedicine and the availability of in person appointments. The patient expressed understanding and agreed to proceed.    History of Present Illness: [name] is a 33 y.o. who identifies as a male who was assigned male at birth, with a history of kidney transplant, presents with symptoms of a sinus infection. He initially fell ill twelve days ago with fever and body aches. He tested negative for the flu at an urgent care center. Since then, he has experienced significant sinus drainage, coughing up green mucus, and constant nasal congestion. He has tried Sudafed to alleviate symptoms, but the congestion persists. He has a known allergy to sulfa drugs and is currently on Cellcept, prednisone, and Prograf for anti-rejection.  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   Chronic kidney disease 03/06/2013   Environmental allergies 03/06/2013    Allergies:  Allergies  Allergen Reactions   Sulfa Antibiotics Hives    Other reaction(s): SWELLING/EDEMA Other reaction(s): SWELLING/EDEMA    Advil [Ibuprofen]     Kidney problems   Dapsone    Septra [Sulfamethoxazole-Trimethoprim]  Nephrotic Syndrome   Medications:  Current Outpatient Medications:    amoxicillin-clavulanate (AUGMENTIN) 875-125 MG tablet, Take 1 tablet by mouth 2 (two) times daily., Disp: 20 tablet, Rfl: 0   fluticasone (FLONASE) 50 MCG/ACT nasal spray, Place 2 sprays into both nostrils daily., Disp: 16 g, Rfl: 0   amLODipine (NORVASC) 5 MG tablet, Take 1 tablet (5  mg total) by mouth daily., Disp: 30 tablet, Rfl: 11   benzonatate (TESSALON PERLES) 100 MG capsule, Take 1 capsule (100 mg total) by mouth 3 (three) times daily as needed. (Patient not taking: Reported on 10/01/2023), Disp: 20 capsule, Rfl: 0   Cholecalciferol 50 MCG (2000 UT) TABS, Take by mouth., Disp: , Rfl:    CYSTAGON 150 MG CAPS, Take 450 capsules by mouth in the morning, at noon, in the evening, and at bedtime., Disp: , Rfl:    ketoconazole (NIZORAL) 2 % cream, Apply to rash twice daily until your follow up appointment (Patient not taking: Reported on 11/24/2023), Disp: 30 g, Rfl: 11   ketoconazole (NIZORAL) 2 % shampoo, Apply topically daily. Use in the shower to wash areas with rash. Leave on for as long as possible (2-5 minutes) before rinsing (Patient not taking: Reported on 11/24/2023), Disp: 120 mL, Rfl: 5   levothyroxine (SYNTHROID) 50 MCG tablet, Take 1 tablet (50 mcg total) by mouth at nightly, Disp: 30 tablet, Rfl: 11   mycophenolate (CELLCEPT) 250 MG capsule, Take 2 capsules (500 mg total) by mouth 2 (two) times daily., Disp: 360 capsule, Rfl: 3   ondansetron (ZOFRAN-ODT) 4 MG disintegrating tablet, Dissolve 1 tablet (4 mg total) by mouth every 8 (eight) hours as needed for nausea or vomiting., Disp: 20 tablet, Rfl: 6   predniSONE (DELTASONE) 5 MG tablet, Take 1 tablet (5 mg total) by mouth daily., Disp: 90 tablet, Rfl: 3   propranolol ER (INDERAL LA) 60 MG 24 hr capsule, Take 1 capsule (60 mg total) by mouth daily., Disp: 30 capsule, Rfl: 11   sodium bicarbonate 650 MG tablet, Take 2 tablets (1,300 mg total) by mouth 2 (two) times daily., Disp: 360 tablet, Rfl: 3   Specialty Vitamins Products (MAGNESIUM, AMINO ACID CHELATE,) 133 MG tablet, Take 1 tablet by mouth 2 times a day. (Patient not taking: Reported on 10/01/2023), Disp: 60 tablet, Rfl: 11   Specialty Vitamins Products (MG PLUS PROTEIN) 133 MG TABS, Take 1 tablet by mouth 2 times a day., Disp: 60 tablet, Rfl: 11   Squaric Acid  Dibutylester LIQD, by Does not apply route. (Patient not taking: Reported on 10/01/2023), Disp: , Rfl:    SUMAtriptan (IMITREX) 100 MG tablet, Take 1 tablet (100 mg total) by mouth every 2 (two) hours as needed for migraine. May repeat in 2 hours if headache persists or recurs., Disp: 12 tablet, Rfl: 6   tacrolimus (PROGRAF) 1 MG capsule, Take 3 capsules (3 mg total) by mouth 2 (two) times daily. (Patient taking differently: Take 3 mg by mouth at bedtime.), Disp: 540 capsule, Rfl: 3   tacrolimus (PROGRAF) 1 MG capsule, Take 4 capsules (4 mg total) by mouth daily AND 3 capsules (3 mg total) Nightly., Disp: 630 capsule, Rfl: 3   tiZANidine (ZANAFLEX) 4 MG tablet, Take 1 tablet (4 mg total) by mouth every 6 (six) hours as needed for muscle spasms., Disp: 30 tablet, Rfl: 6   valACYclovir (VALTREX) 500 MG tablet, Take 1 tablet (500 mg total) by mouth in the morning. (Patient not taking: Reported on 11/24/2023), Disp: 90 tablet, Rfl: 2  Observations/Objective: Patient is well-developed, well-nourished in no acute distress.  Resting comfortably  at home.  Head is normocephalic, atraumatic.  No labored breathing.  Speech is clear and coherent with logical content.  Patient is alert and oriented at baseline.    Assessment and Plan: 1. Acute non-recurrent frontal sinusitis (Primary) - amoxicillin-clavulanate (AUGMENTIN) 875-125 MG tablet; Take 1 tablet by mouth 2 (two) times daily.  Dispense: 20 tablet; Refill: 0 - fluticasone (FLONASE) 50 MCG/ACT nasal spray; Place 2 sprays into both nostrils daily.  Dispense: 16 g; Refill: 0   Symptoms consistent with acute sinusitis, including sinus drainage, nasal congestion, and productive cough with green mucus, persisting for 12 days following initial fever and body aches. Kidney transplant and current immunosuppressive therapy complicate antibiotic selection due to potential drug interactions. Augmentin is considered appropriate despite potential interaction with  Cellcept, as it is for short-term use.   - Prescribe Augmentin to be taken twice daily for 10 days. - Prescribe Flonase to alleviate nasal congestion and pressure.   Follow Up Instructions: I discussed the assessment and treatment plan with the patient. The patient was provided an opportunity to ask questions and all were answered. The patient agreed with the plan and demonstrated an understanding of the instructions.  A copy of instructions were sent to the patient via MyChart unless otherwise noted below.     The patient was advised to call back or seek an in-person evaluation if the symptoms worsen or if the condition fails to improve as anticipated.    Joshua Knudsen Mayers, PA-C

## 2024-01-23 NOTE — Patient Instructions (Signed)
 Vedia Coffer, thank you for joining Roney Jaffe, PA-C for today's virtual visit.  While this provider is not your primary care provider (PCP), if your PCP is located in our provider database this encounter information will be shared with them immediately following your visit.   A [facility] MyChart account gives you access to today's visit and all your visits, tests, and labs performed at [facility] " click here if you don't have a [facility] MyChart account or go to [email or web address]  Consent: (Patient) Joshua Atkinson provided verbal consent for this virtual visit at the beginning of the encounter.  Current Medications:  Current Outpatient Medications:    amoxicillin-clavulanate (AUGMENTIN) 875-125 MG tablet, Take 1 tablet by mouth 2 (two) times daily., Disp: 20 tablet, Rfl: 0   fluticasone (FLONASE) 50 MCG/ACT nasal spray, Place 2 sprays into both nostrils daily., Disp: 16 g, Rfl: 0   amLODipine (NORVASC) 5 MG tablet, Take 1 tablet (5 mg total) by mouth daily., Disp: 30 tablet, Rfl: 11   benzonatate (TESSALON PERLES) 100 MG capsule, Take 1 capsule (100 mg total) by mouth 3 (three) times daily as needed. (Patient not taking: Reported on 10/01/2023), Disp: 20 capsule, Rfl: 0   Cholecalciferol 50 MCG (2000 UT) TABS, Take by mouth., Disp: , Rfl:    CYSTAGON 150 MG CAPS, Take 450 capsules by mouth in the morning, at noon, in the evening, and at bedtime., Disp: , Rfl:    ketoconazole (NIZORAL) 2 % cream, Apply to rash twice daily until your follow up appointment (Patient not taking: Reported on 11/24/2023), Disp: 30 g, Rfl: 11   ketoconazole (NIZORAL) 2 % shampoo, Apply topically daily. Use in the shower to wash areas with rash. Leave on for as long as possible (2-5 minutes) before rinsing (Patient not taking: Reported on 11/24/2023), Disp: 120 mL, Rfl: 5   levothyroxine (SYNTHROID) 50 MCG tablet, Take 1 tablet (50 mcg total) by mouth at nightly, Disp: 30 tablet, Rfl:  11   mycophenolate (CELLCEPT) 250 MG capsule, Take 2 capsules (500 mg total) by mouth 2 (two) times daily., Disp: 360 capsule, Rfl: 3   ondansetron (ZOFRAN-ODT) 4 MG disintegrating tablet, Dissolve 1 tablet (4 mg total) by mouth every 8 (eight) hours as needed for nausea or vomiting., Disp: 20 tablet, Rfl: 6   predniSONE (DELTASONE) 5 MG tablet, Take 1 tablet (5 mg total) by mouth daily., Disp: 90 tablet, Rfl: 3   propranolol ER (INDERAL LA) 60 MG 24 hr capsule, Take 1 capsule (60 mg total) by mouth daily., Disp: 30 capsule, Rfl: 11   sodium bicarbonate 650 MG tablet, Take 2 tablets (1,300 mg total) by mouth 2 (two) times daily., Disp: 360 tablet, Rfl: 3   Specialty Vitamins Products (MAGNESIUM, AMINO ACID CHELATE,) 133 MG tablet, Take 1 tablet by mouth 2 times a day. (Patient not taking: Reported on 10/01/2023), Disp: 60 tablet, Rfl: 11   Specialty Vitamins Products (MG PLUS PROTEIN) 133 MG TABS, Take 1 tablet by mouth 2 times a day., Disp: 60 tablet, Rfl: 11   Squaric Acid Dibutylester LIQD, by Does not apply route. (Patient not taking: Reported on 10/01/2023), Disp: , Rfl:    SUMAtriptan (IMITREX) 100 MG tablet, Take 1 tablet (100 mg total) by mouth every 2 (two) hours as needed for migraine. May repeat in 2 hours if headache persists or recurs., Disp: 12 tablet, Rfl: 6   tacrolimus (PROGRAF) 1 MG capsule, Take 3 capsules (3 mg total) by mouth 2 (two)  times daily. (Patient taking differently: Take 3 mg by mouth at bedtime.), Disp: 540 capsule, Rfl: 3   tacrolimus (PROGRAF) 1 MG capsule, Take 4 capsules (4 mg total) by mouth daily AND 3 capsules (3 mg total) Nightly., Disp: 630 capsule, Rfl: 3   tiZANidine (ZANAFLEX) 4 MG tablet, Take 1 tablet (4 mg total) by mouth every 6 (six) hours as needed for muscle spasms., Disp: 30 tablet, Rfl: 6   valACYclovir (VALTREX) 500 MG tablet, Take 1 tablet (500 mg total) by mouth in the morning. (Patient not taking: Reported on 11/24/2023), Disp: 90 tablet, Rfl: 2    Medications ordered in this encounter:  Meds ordered this encounter  Medications   amoxicillin-clavulanate (AUGMENTIN) 875-125 MG tablet    Sig: Take 1 tablet by mouth 2 (two) times daily.    Dispense:  20 tablet    Refill:  0    Supervising Provider:   Merrilee Jansky [[id number]]   fluticasone (FLONASE) 50 MCG/ACT nasal spray    Sig: Place 2 sprays into both nostrils daily.    Dispense:  16 g    Refill:  0    Supervising Provider:   Merrilee Jansky [[id number]]     *If you need refills on other medications prior to your next appointment, please contact your pharmacy*  Follow-Up: Call back or seek an in-person evaluation if the symptoms worsen or if the condition fails to improve as anticipated.  [facility] [phone]  Other Instructions Sinus Infection, Adult A sinus infection, also called sinusitis, is inflammation of your sinuses. Sinuses are hollow spaces in the bones around your face. Your sinuses are located: Around your eyes. In the middle of your forehead. Behind your nose. In your cheekbones. Mucus normally drains out of your sinuses. When your nasal tissues become inflamed or swollen, mucus can become trapped or blocked. This allows bacteria, viruses, and fungi to grow, which leads to infection. Most infections of the sinuses are caused by a virus. A sinus infection can develop quickly. It can last for up to 4 weeks (acute) or for more than 12 weeks (chronic). A sinus infection often develops after a cold. What are the causes? This condition is caused by anything that creates swelling in the sinuses or stops mucus from draining. This includes: Allergies. Asthma. Infection from bacteria or viruses. Deformities or blockages in your nose or sinuses. Abnormal growths in the nose (nasal polyps). Pollutants, such as chemicals or irritants in the air. Infection from fungi. This is rare. What increases the risk? You are more likely to develop this  condition if you: Have a weak body defense system (immune system). Do a lot of swimming or diving. Overuse nasal sprays. Smoke. What are the signs or symptoms? The main symptoms of this condition are pain and a feeling of pressure around the affected sinuses. Other symptoms include: Stuffy nose or congestion that makes it difficult to breathe through your nose. Thick yellow or greenish drainage from your nose. Tenderness, swelling, and warmth over the affected sinuses. A cough that may get worse at night. Decreased sense of smell and taste. Extra mucus that collects in the throat or the back of the nose (postnasal drip) causing a sore throat or bad breath. Tiredness (fatigue). Fever. How is this diagnosed? This condition is diagnosed based on: Your symptoms. Your medical history. A physical exam. Tests to find out if your condition is acute or chronic. This may include: Checking your nose for nasal polyps. Viewing  your sinuses using a device that has a light (endoscope). Testing for allergies or bacteria. Imaging tests, such as an MRI or CT scan. In rare cases, a bone biopsy may be done to rule out more serious types of fungal sinus disease. How is this treated? Treatment for a sinus infection depends on the cause and whether your condition is chronic or acute. If caused by a virus, your symptoms should go away on their own within 10 days. You may be given medicines to relieve symptoms. They include: Medicines that shrink swollen nasal passages (decongestants). A spray that eases inflammation of the nostrils (topical intranasal corticosteroids). Rinses that help get rid of thick mucus in your nose (nasal saline washes). Medicines that treat allergies (antihistamines). Over-the-counter pain relievers. If caused by bacteria, your health care provider may recommend waiting to see if your symptoms improve. Most bacterial infections will get better without antibiotic medicine. You may be  given antibiotics if you have: A severe infection. A weak immune system. If caused by narrow nasal passages or nasal polyps, surgery may be needed. Follow these instructions at home: Medicines Take, use, or apply over-the-counter and prescription medicines only as told by your health care provider. These may include nasal sprays. If you were prescribed an antibiotic medicine, take it as told by your health care provider. Do not stop taking the antibiotic even if you start to feel better. Hydrate and humidify  Drink enough fluid to keep your urine pale yellow. Staying hydrated will help to thin your mucus. Use a cool mist humidifier to keep the humidity level in your home above 50%. Inhale steam for 10-15 minutes, 3-4 times a day, or as told by your health care provider. You can do this in the bathroom while a hot shower is running. Limit your exposure to cool or dry air. Rest Rest as much as possible. Sleep with your head raised (elevated). Make sure you get enough sleep each night. General instructions  Apply a warm, moist washcloth to your face 3-4 times a day or as told by your health care provider. This will help with discomfort. Use nasal saline washes as often as told by your health care provider. Wash your hands often with soap and water to reduce your exposure to germs. If soap and water are not available, use hand sanitizer. Do not smoke. Avoid being around people who are smoking (secondhand smoke). Keep all follow-up visits. This is important. Contact a health care provider if: You have a fever. Your symptoms get worse. Your symptoms do not improve within 10 days. Get help right away if: You have a severe headache. You have persistent vomiting. You have severe pain or swelling around your face or eyes. You have vision problems. You develop confusion. Your neck is stiff. You have trouble breathing. These symptoms may be an emergency. Get help right away. Call 911. Do  not wait to see if the symptoms will go away. Do not drive yourself to the hospital. Summary A sinus infection is soreness and inflammation of your sinuses. Sinuses are hollow spaces in the bones around your face. This condition is caused by nasal tissues that become inflamed or swollen. The swelling traps or blocks the flow of mucus. This allows bacteria, viruses, and fungi to grow, which leads to infection. If you were prescribed an antibiotic medicine, take it as told by your health care provider. Do not stop taking the antibiotic even if you start to feel better. Keep all follow-up visits. This  is important. This information is not intended to replace advice given to you by your health care provider. Make sure you discuss any questions you have with your health care provider. Document Revised: 09/17/2021 Document Reviewed: 09/17/2021 Elsevier Patient Education  2024 [employer]   If you have been instructed to have an in-person evaluation today at a local Urgent Care facility, please use the link below. It will take you to a list of all of our available [facility], including address, phone number and hours of operation. Please do not delay care.  [facility]  If you or a family member do not have a primary care provider, use the link below to schedule a visit and establish care. When you choose a [facility] primary care physician or advanced practice provider, you gain a long-term partner in health. Find a Primary Care Provider  Learn more about [facility]'s in-office and virtual care options: [facility] - Get Care Now

## 2024-01-25 DIAGNOSIS — Z940 Kidney transplant status: Principal | ICD-10-CM

## 2024-01-25 DIAGNOSIS — Z1159 Encounter for screening for other viral diseases: Principal | ICD-10-CM

## 2024-01-25 DIAGNOSIS — Z79899 Other long term (current) drug therapy: Principal | ICD-10-CM

## 2024-02-01 ENCOUNTER — Other Ambulatory Visit (HOSPITAL_COMMUNITY): Payer: Self-pay

## 2024-02-02 ENCOUNTER — Other Ambulatory Visit: Payer: Self-pay | Admitting: Neurology

## 2024-02-02 ENCOUNTER — Other Ambulatory Visit (HOSPITAL_COMMUNITY): Payer: Self-pay

## 2024-02-02 ENCOUNTER — Other Ambulatory Visit: Payer: Self-pay

## 2024-02-02 MED ORDER — PROPRANOLOL HCL ER 60 MG PO CP24
60.0000 mg | ORAL_CAPSULE | Freq: Every day | ORAL | 11 refills | Status: AC
  Filled 2024-02-23: qty 30, 30d supply, fill #0
  Filled 2024-02-24: qty 90, 90d supply, fill #0
  Filled 2024-05-24: qty 90, 90d supply, fill #1
  Filled 2024-08-23: qty 90, 90d supply, fill #2
  Filled 2024-11-14: qty 90, 90d supply, fill #3

## 2024-02-20 ENCOUNTER — Other Ambulatory Visit: Payer: Self-pay | Admitting: Physician Assistant

## 2024-02-20 DIAGNOSIS — J0110 Acute frontal sinusitis, unspecified: Secondary | ICD-10-CM

## 2024-02-22 ENCOUNTER — Ambulatory Visit: Admit: 2024-02-22 | Discharge: 2024-02-23 | Payer: PRIVATE HEALTH INSURANCE

## 2024-02-22 DIAGNOSIS — Z940 Kidney transplant status: Secondary | ICD-10-CM | POA: Diagnosis not present

## 2024-02-22 DIAGNOSIS — Z79899 Other long term (current) drug therapy: Secondary | ICD-10-CM | POA: Diagnosis not present

## 2024-02-22 DIAGNOSIS — Z1159 Encounter for screening for other viral diseases: Secondary | ICD-10-CM | POA: Diagnosis not present

## 2024-02-23 ENCOUNTER — Other Ambulatory Visit (HOSPITAL_COMMUNITY): Payer: Self-pay

## 2024-02-24 ENCOUNTER — Other Ambulatory Visit: Payer: Self-pay

## 2024-02-25 ENCOUNTER — Ambulatory Visit
Admit: 2024-02-25 | Discharge: 2024-02-25 | Payer: PRIVATE HEALTH INSURANCE | Attending: Nephrology | Primary: Nephrology

## 2024-02-25 ENCOUNTER — Ambulatory Visit: Admit: 2024-02-25 | Discharge: 2024-02-25 | Payer: PRIVATE HEALTH INSURANCE

## 2024-02-25 ENCOUNTER — Other Ambulatory Visit (HOSPITAL_COMMUNITY): Payer: Self-pay

## 2024-02-25 ENCOUNTER — Other Ambulatory Visit: Payer: Self-pay

## 2024-02-25 DIAGNOSIS — Z8619 Personal history of other infectious and parasitic diseases: Secondary | ICD-10-CM | POA: Diagnosis not present

## 2024-02-25 DIAGNOSIS — Z940 Kidney transplant status: Secondary | ICD-10-CM | POA: Diagnosis not present

## 2024-02-25 DIAGNOSIS — Z48298 Encounter for aftercare following other organ transplant: Secondary | ICD-10-CM | POA: Diagnosis not present

## 2024-02-25 DIAGNOSIS — D849 Immunodeficiency, unspecified: Secondary | ICD-10-CM | POA: Diagnosis not present

## 2024-02-25 DIAGNOSIS — Z79899 Other long term (current) drug therapy: Secondary | ICD-10-CM | POA: Diagnosis not present

## 2024-02-25 DIAGNOSIS — B079 Viral wart, unspecified: Secondary | ICD-10-CM | POA: Diagnosis not present

## 2024-02-25 DIAGNOSIS — E7204 Cystinosis: Secondary | ICD-10-CM | POA: Diagnosis not present

## 2024-02-25 DIAGNOSIS — Z1159 Encounter for screening for other viral diseases: Secondary | ICD-10-CM | POA: Diagnosis not present

## 2024-02-25 MED ORDER — SODIUM BICARBONATE 650 MG PO TABS
1300.0000 mg | ORAL_TABLET | Freq: Two times a day (BID) | ORAL | 1 refills | Status: AC
  Filled 2024-02-25: qty 120, 30d supply, fill #0
  Filled 2024-08-23: qty 120, 30d supply, fill #1

## 2024-02-25 MED ORDER — SODIUM BICARBONATE 650 MG TABLET
ORAL_TABLET | Freq: Two times a day (BID) | ORAL | 1 refills | 30.00000 days | Status: CP
Start: 2024-02-25 — End: ?

## 2024-03-04 DIAGNOSIS — Z940 Kidney transplant status: Principal | ICD-10-CM

## 2024-03-04 DIAGNOSIS — Z79899 Other long term (current) drug therapy: Principal | ICD-10-CM

## 2024-03-04 DIAGNOSIS — Z1159 Encounter for screening for other viral diseases: Principal | ICD-10-CM

## 2024-03-11 DIAGNOSIS — Z940 Kidney transplant status: Principal | ICD-10-CM

## 2024-03-25 ENCOUNTER — Ambulatory Visit: Admit: 2024-03-25 | Discharge: 2024-03-25 | Payer: PRIVATE HEALTH INSURANCE

## 2024-03-25 DIAGNOSIS — Z79899 Other long term (current) drug therapy: Secondary | ICD-10-CM | POA: Diagnosis not present

## 2024-03-25 DIAGNOSIS — B078 Other viral warts: Secondary | ICD-10-CM | POA: Diagnosis not present

## 2024-03-25 DIAGNOSIS — Z4822 Encounter for aftercare following kidney transplant: Secondary | ICD-10-CM | POA: Diagnosis not present

## 2024-03-25 DIAGNOSIS — Z1159 Encounter for screening for other viral diseases: Secondary | ICD-10-CM | POA: Diagnosis not present

## 2024-03-25 DIAGNOSIS — Z940 Kidney transplant status: Secondary | ICD-10-CM | POA: Diagnosis not present

## 2024-03-25 DIAGNOSIS — R21 Rash and other nonspecific skin eruption: Secondary | ICD-10-CM | POA: Diagnosis not present

## 2024-03-28 ENCOUNTER — Other Ambulatory Visit (HOSPITAL_COMMUNITY): Payer: Self-pay

## 2024-03-28 DIAGNOSIS — Z79899 Other long term (current) drug therapy: Principal | ICD-10-CM

## 2024-03-28 DIAGNOSIS — Z940 Kidney transplant status: Principal | ICD-10-CM

## 2024-03-28 DIAGNOSIS — Z1159 Encounter for screening for other viral diseases: Principal | ICD-10-CM

## 2024-03-28 MED ORDER — PREDNISONE 5 MG TABLET
ORAL_TABLET | Freq: Every day | ORAL | 3 refills | 90.00000 days | Status: CP
Start: 2024-03-28 — End: ?

## 2024-03-29 ENCOUNTER — Other Ambulatory Visit (HOSPITAL_COMMUNITY): Payer: Self-pay

## 2024-03-29 MED ORDER — PREDNISONE 5 MG PO TABS
5.0000 mg | ORAL_TABLET | Freq: Every day | ORAL | 3 refills | Status: AC
  Filled 2024-03-29: qty 90, 90d supply, fill #0
  Filled 2024-07-08: qty 90, 90d supply, fill #1
  Filled 2024-10-05: qty 90, 90d supply, fill #2

## 2024-04-06 ENCOUNTER — Other Ambulatory Visit (HOSPITAL_COMMUNITY): Payer: Self-pay

## 2024-04-06 ENCOUNTER — Other Ambulatory Visit: Payer: Self-pay

## 2024-04-06 MED ORDER — AMLODIPINE BESYLATE 5 MG PO TABS
5.0000 mg | ORAL_TABLET | Freq: Every day | ORAL | 11 refills | Status: AC
  Filled 2024-04-06 – 2024-04-26 (×2): qty 30, 30d supply, fill #0
  Filled 2024-05-24: qty 30, 30d supply, fill #1
  Filled 2024-06-21: qty 30, 30d supply, fill #2
  Filled 2024-07-25: qty 30, 30d supply, fill #3
  Filled 2024-08-23: qty 30, 30d supply, fill #4
  Filled 2024-09-27: qty 30, 30d supply, fill #5

## 2024-04-06 MED ORDER — AMLODIPINE 5 MG TABLET
ORAL_TABLET | Freq: Every day | ORAL | 11 refills | 30.00000 days | Status: CP
Start: 2024-04-06 — End: ?

## 2024-04-08 ENCOUNTER — Other Ambulatory Visit (HOSPITAL_COMMUNITY): Payer: Self-pay

## 2024-04-13 ENCOUNTER — Ambulatory Visit (INDEPENDENT_AMBULATORY_CARE_PROVIDER_SITE_OTHER): Payer: 59 | Admitting: Adult Health

## 2024-04-13 ENCOUNTER — Other Ambulatory Visit: Payer: Self-pay

## 2024-04-13 ENCOUNTER — Encounter: Payer: Self-pay | Admitting: Adult Health

## 2024-04-13 VITALS — BP 115/69 | HR 71 | Ht 68.0 in | Wt 150.0 lb

## 2024-04-13 DIAGNOSIS — G935 Compression of brain: Secondary | ICD-10-CM | POA: Diagnosis not present

## 2024-04-13 DIAGNOSIS — G43009 Migraine without aura, not intractable, without status migrainosus: Secondary | ICD-10-CM | POA: Diagnosis not present

## 2024-04-13 MED ORDER — SUMATRIPTAN SUCCINATE 100 MG PO TABS
100.0000 mg | ORAL_TABLET | ORAL | 11 refills | Status: AC | PRN
  Filled 2024-04-13: qty 12, fill #0
  Filled 2024-07-08: qty 9, 30d supply, fill #0
  Filled 2024-10-24 – 2024-10-26 (×2): qty 9, 30d supply, fill #1

## 2024-04-13 NOTE — Patient Instructions (Signed)
 Your Plan:  Continue propranolol  ER 60mg  daily for migraine prevention  Continue sumatriptan  as needed      Follow up in 9 months or call sooner if needed     Thank you for coming to see us  at [facility]. I hope we have been able to provide you high quality care today.  You may receive a patient satisfaction survey over the next few weeks. We would appreciate your feedback and comments so that we may continue to improve ourselves and the health of our patients.

## 2024-04-13 NOTE — Progress Notes (Signed)
 Chief Complaint  Patient presents with   Headache    Rm 3 alone Pt is well, reports in Feb he had about 10-15 headaches. He recently has been having about 4 a month.  Sumatriptan  does help abort.  No new concerns.       ASSESSMENT AND PLAN  Joshua Atkinson is a 33 y.o. 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   MRI brain 10/06/2023 IMPRESSION: Unremarkable MRI scan of the brain without contrast.  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   MRI C-spine 11/27/2021 IMPRESSION:  Unremarkable MRI cervical spine (without). 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 No syringomyelia.   MRI L-spine 11/27/2021 IMPRESSION:  Unremarkable MRI lumbar spine (without).   MRI T-spine 11/27/2021 IMPRESSION:  Normal MRI thoracic spine (without).      MEDICAL HISTORY:  Update 04/13/2024 JM: Patient returns for follow-up visit.  Reports increased migraine frequency in the month of February, around 10-15. He started getting back into running which he believes greatly improved his migraines, currently having about 1 to 4/month.  Continues on propranolol  ER 60 mg daily tolerating without side effects.  Reports benefit with sumatriptan , usually resolves about 45 minutes. Has used tizanidine  occasionally with benefit.   He was  seen by neurosurgery 10/2023, OV note reviewed, for Chiari malformation with approximately 7 to 8 mm tonsillar herniation no associated syrinx. Since syrinx not present, did not feel surgical intervention required. Plan on returning to neurosurgery if headaches unmanageable with medications.    Consult visit 10/01/2023 Dr. Gracie Lav: Joshua Atkinson is a 33 year old male, accompanied by his wife, who is oncology PA at [facility], seen in request by his primary care from [facility], Dr. Fairy Homer, Geralyn Knee A to follow-up on migraine headaches, he was seen by Dr. Billy Bue once in May 2023,  History is obtained from the patient and review of electronic medical records. I personally reviewed pertinent available imaging films in PACS.   PMHx of  HTN Hypothyrodism Kidney transplant in 2017, 2021, infantile cystinosis,   He has history of infantile cystinosis, kidney transplant twice, most recent 26 September 2020, taking CellCept , prednisone , followed by by [facility] nephrology  He has lifelong history of headache, getting worse since 2022, retro-orbital area headache sometimes occipital region, with light, noise sensitivity, occasionally nauseous, can lasting for hours, or even longer  [facility] of the brain [facility] in 2021 --No acute intracranial abnormality. No intracranial lesions are identified.   --Findings consistent with Chiari type I malformation with the cerebellar tonsils extending 0.9 cm below the foramen magnum. No evidence of syrinx in the visualized portions of the upper cervical spine.  He was seen by Dr. Billy Bue, in May 2023, given the prescription of Maxalt , works well most of the time, he is now having average 1 migraine each week, sometimes can be so severe, extending to his neck, failed response  to Maxalt , given his severe headache, he can also have dizziness, unsteady gait, but no persistent motor or sensory deficit, no bowel and bladder incontinence  Personally reviewed MRI of cervical  spine February 2023, 5 mm downward cerebellar tonsillar ectopia, no syringomyelia, normal MRI thoracic spine, and the lumbar spine  PHYSICAL EXAM:   Vitals:   04/13/24 1335  BP: 115/69  Pulse: 71  Weight: 150 lb (68 kg)  Height: 5' 8 (1.727 m)   Body mass index is 22.81 kg/m.   PHYSICAL EXAMNIATION:  Gen: NAD, conversant, well nourised, well groomed                     Cardiovascular: Regular rate rhythm, no peripheral edema, warm, nontender. Eyes: Conjunctivae clear without exudates or hemorrhage Neck: Supple, no carotid bruits. Pulmonary: Clear to auscultation bilaterally   NEUROLOGICAL EXAM:  MENTAL STATUS: Speech/cognition: Awake, alert, oriented to history taking and casual conversation CRANIAL NERVES: CN II: Visual fields are full to confrontation. Pupils are round equal and briskly reactive to light. CN III, IV, VI: extraocular movement are normal. No ptosis. CN V: Facial sensation is intact to light touch CN VII: Face is symmetric with normal eye closure  CN VIII: Hearing is normal to causal conversation. CN IX, X: Phonation is normal. CN XI: Head turning and shoulder shrug are intact  MOTOR: There is no pronator drift of out-stretched arms. Muscle bulk and tone are normal. Muscle strength is normal.  REFLEXES: Reflexes are 2+ and symmetric at the biceps, triceps, knees, and ankles. Plantar responses are flexor.  SENSORY: Intact to light touch, pinprick and vibratory sensation are intact in fingers and toes.  COORDINATION: There is no trunk or limb dysmetria noted.  GAIT/STANCE: Posture is normal. Gait is steady with normal steps, base, arm swing, and turning. Heel and toe walking are normal. Tandem gait is normal.      REVIEW OF SYSTEMS:  Full 14 system review of systems performed and notable only for as above All other review of systems were negative.   ALLERGIES: Allergies  Allergen Reactions   Sulfa Antibiotics Hives    Other reaction(s):  SWELLING/EDEMA Other reaction(s): SWELLING/EDEMA    Advil [Ibuprofen]     Kidney problems   Dapsone    Septra [Sulfamethoxazole-Trimethoprim]     Nephrotic Syndrome    HOME MEDICATIONS: Current Outpatient Medications  Medication Sig Dispense Refill   amLODipine  (NORVASC ) 5 MG tablet Take 1 tablet (5 mg total) by mouth daily. 30 tablet 11   amoxicillin -clavulanate (AUGMENTIN ) 875-125 MG tablet Take 1 tablet by mouth 2 (two) times daily. 20 tablet 0   Cholecalciferol 50 MCG (2000 UT) TABS Take by mouth.     CYSTAGON 150 MG CAPS Take 450 capsules by mouth in the morning, at noon, in the evening, and at bedtime.     fluticasone  (FLONASE ) 50 MCG/ACT nasal spray Place 2 sprays into both nostrils daily. 16 g 0   ketoconazole  (NIZORAL ) 2 % cream Apply to rash twice daily until your follow up appointment 30 g 11   ketoconazole  (NIZORAL ) 2 % shampoo Apply topically daily. Use in the shower to wash areas with rash. Leave on for as long as possible (2-5 minutes) before rinsing 120 mL 5   levothyroxine  (SYNTHROID ) 50 MCG tablet Take 1 tablet (50 mcg total) by mouth at nightly 30 tablet 11   mycophenolate  (CELLCEPT ) 250 MG capsule Take 2 capsules (500 mg total) by mouth 2 (two) times daily. 360 capsule 3  ondansetron  (ZOFRAN -ODT) 4 MG disintegrating tablet Dissolve 1 tablet (4 mg total) by mouth every 8 (eight) hours as needed for nausea or vomiting. 20 tablet 6   predniSONE  (DELTASONE ) 5 MG tablet Take 1 tablet (5 mg total) by mouth daily. 90 tablet 3   propranolol  ER (INDERAL  LA) 60 MG 24 hr capsule Take 1 capsule (60 mg total) by mouth daily. 30 capsule 11   sodium bicarbonate  650 MG tablet Take 2 tablets (1,300 mg total) by mouth 2 (two) times daily. 360 tablet 3   sodium bicarbonate  650 MG tablet Take 2 tablets (1,300 mg total) by mouth 2 (two) times daily. 120 tablet 1   Specialty Vitamins Products (MAGNESIUM, AMINO ACID CHELATE,) 133 MG tablet Take 1 tablet by mouth 2 times a day. 60 tablet 11    Specialty Vitamins Products (MG PLUS PROTEIN) 133 MG TABS Take 1 tablet by mouth 2 times a day. 60 tablet 11   Squaric Acid Dibutylester LIQD by Does not apply route.     SUMAtriptan  (IMITREX ) 100 MG tablet Take 1 tablet (100 mg total) by mouth every 2 (two) hours as needed for migraine. May repeat in 2 hours if headache persists or recurs. 12 tablet 6   tacrolimus  (PROGRAF ) 1 MG capsule Take 3 capsules (3 mg total) by mouth 2 (two) times daily. (Patient taking differently: Take 3 mg by mouth at bedtime.) 540 capsule 3   tacrolimus  (PROGRAF ) 1 MG capsule Take 4 capsules (4 mg total) by mouth daily AND 3 capsules (3 mg total) Nightly. 630 capsule 3   tiZANidine  (ZANAFLEX ) 4 MG tablet Take 1 tablet (4 mg total) by mouth every 6 (six) hours as needed for muscle spasms. 30 tablet 6   No current facility-administered medications for this visit.    PAST MEDICAL HISTORY: Past Medical History:  Diagnosis Date   Allergy    Chiari I malformation (HCC)    Chronic kidney disease    COVID 08/2019   Cystinosis (HCC)    Generalized headaches    History of chicken pox    Hypertension    Hypothyroidism    Kidney transplanted    VZV (varicella-zoster virus) infection    hx of   Warts     PAST SURGICAL HISTORY: Past Surgical History:  Procedure Laterality Date   KIDNEY TRANSPLANT  2017   again in 09/2020   MYRINGOTOMY WITH TUBE PLACEMENT Bilateral    multiple as a child   RENAL BIOPSY  10/27/1994   Nephrotic Syndrome    FAMILY HISTORY: Family History  Problem Relation Age of Onset   Arthritis Mother    Cancer Mother        breast cancer   Arthritis Father    Hypertension Father    Arthritis Maternal Grandmother    Hypertension Paternal Grandmother    Hypertension Paternal Grandfather    Diabetes Paternal Grandfather     SOCIAL HISTORY: Social History   Socioeconomic History   Marital status: Married    Spouse name: Not on file   Number of children: 0   Years of education:  Not on file   Highest education level: Not on file  Occupational History   Not on file  Tobacco Use   Smoking status: Never   Smokeless tobacco: Never  Substance and Sexual Activity   Alcohol use: No    Alcohol/week: 0.0 standard drinks of alcohol   Drug use: No   Sexual activity: Not on file  Other Topics Concern  Not on file  Social History Narrative   Right handed   Caffeine-1 cup daily   Married, lives with wife   Works as Curator   Social Drivers of Corporate investment banker Strain: Not on file  Food Insecurity: No Food Insecurity (10/02/2020)   [facility]   Hunger Vital Sign    Within the past 12 months, you worried that your food would run out before you got the money to buy more.: Never true    Within the past 12 months, the food you bought just didn't last and you didn't have money to get more.: Never true  Transportation Needs: Not on file  Physical Activity: Not on file  Stress: Not on file  Social Connections: Not on file  Intimate Partner Violence: Not on file      I personally spent a total of 25 minutes in the care of the patient today including preparing to see the patient, performing a medically appropriate exam/evaluation, counseling and educating, placing orders, and documenting clinical information in the EHR. This is our first time meeting and time has been spent reviewing past medical history and relevant medical records.  Johny Nap, AGNP-BC  [facility] [address] [place], [place] [address]  Phone [phone] Fax [phone] Note: This document was prepared with digital dictation and possible smart phrase technology. Any transcriptional errors that result from this process are unintentional.

## 2024-04-14 ENCOUNTER — Ambulatory Visit: Admit: 2024-04-14 | Discharge: 2024-04-15 | Payer: PRIVATE HEALTH INSURANCE

## 2024-04-14 ENCOUNTER — Encounter
Admit: 2024-04-14 | Discharge: 2024-04-15 | Payer: PRIVATE HEALTH INSURANCE | Attending: Nephrology | Primary: Nephrology

## 2024-04-14 ENCOUNTER — Other Ambulatory Visit (HOSPITAL_COMMUNITY): Payer: Self-pay

## 2024-04-14 DIAGNOSIS — Z1159 Encounter for screening for other viral diseases: Secondary | ICD-10-CM | POA: Diagnosis not present

## 2024-04-14 DIAGNOSIS — Z79899 Other long term (current) drug therapy: Secondary | ICD-10-CM | POA: Diagnosis not present

## 2024-04-14 DIAGNOSIS — Z940 Kidney transplant status: Secondary | ICD-10-CM | POA: Diagnosis not present

## 2024-04-19 ENCOUNTER — Ambulatory Visit
Admission: EM | Admit: 2024-04-19 | Discharge: 2024-04-19 | Disposition: A | Discharge status: Home / Self Care | Attending: Emergency Medicine | Admitting: Emergency Medicine

## 2024-04-19 DIAGNOSIS — J0101 Acute recurrent maxillary sinusitis: Secondary | ICD-10-CM

## 2024-04-19 MED ORDER — DOXYCYCLINE HYCLATE 100 MG PO CAPS: 100.0000 mg | ORAL_CAPSULE | Freq: Two times a day (BID) | ORAL | 0 refills | Status: AC

## 2024-04-19 NOTE — ED Provider Notes (Signed)
 Joshua Atkinson    CSN: [id number] Arrival date & time: 04/19/24  9163      History   Chief Complaint Chief Complaint  Patient presents with   Cough   Sore Throat    HPI Joshua Atkinson is a 33 y.o. male.  Patient presents with 10-day history of sinus pressure, congestion, sore throat, hoarse voice, productive cough.  No fever, chills, shortness of breath.  No OTC medications taken.  His medical history includes kidney transplant.  Patient had a telehealth visit on 01/23/2024 for acute sinusitis and was treated with Augmentin .  The history is provided by the patient and medical records.    Past Medical History:  Diagnosis Date   Allergy    Chiari I malformation (HCC)    Chronic kidney disease    COVID 08/2019   Cystinosis (HCC)    Generalized headaches    History of chicken pox    Hypertension    Hypothyroidism    Kidney transplanted    VZV (varicella-zoster virus) infection    hx of   Warts     Patient Active Problem List   Diagnosis Date Noted   Chiari malformation type I (HCC) 10/01/2023   Migraine without aura and without status migrainosus, not intractable 10/01/2023   Influenza A 12/05/2018   Kidney replaced by transplant 08/05/2016   URI (upper respiratory infection) 02/26/2016   Fever, unspecified 02/26/2016   Cystinosis (HCC) 03/06/2013   Chronic kidney disease 03/06/2013   Environmental allergies 03/06/2013    Past Surgical History:  Procedure Laterality Date   KIDNEY TRANSPLANT  2017   again in 09/2020   MYRINGOTOMY WITH TUBE PLACEMENT Bilateral    multiple as a child   RENAL BIOPSY  10/27/1994   Nephrotic Syndrome       Home Medications    Prior to Admission medications   Medication Sig Start Date End Date Taking? Authorizing Provider  doxycycline  (VIBRAMYCIN ) 100 MG capsule Take 1 capsule (100 mg total) by mouth 2 (two) times daily for 7 days. 04/19/24 04/26/24 Yes Corlis Burnard DEL, NP  amLODipine  (NORVASC ) 5 MG tablet Take 1 tablet  (5 mg total) by mouth daily. 04/06/24     Cholecalciferol 50 MCG (2000 UT) TABS Take by mouth. 03/22/21   [provider]  CYSTAGON 150 MG CAPS Take 450 capsules by mouth in the morning, at noon, in the evening, and at bedtime. 01/13/22   [provider]  fluticasone  (FLONASE ) 50 MCG/ACT nasal spray Place 2 sprays into both nostrils daily. 01/23/24   Mayers, Cari S, PA-C  ketoconazole  (NIZORAL ) 2 % cream Apply to rash twice daily until your follow up appointment 04/09/23     ketoconazole  (NIZORAL ) 2 % shampoo Apply topically daily. Use in the shower to wash areas with rash. Leave on for as long as possible (2-5 minutes) before rinsing 04/09/23     levothyroxine  (SYNTHROID ) 50 MCG tablet Take 1 tablet (50 mcg total) by mouth at nightly 10/30/23     mycophenolate  (CELLCEPT ) 250 MG capsule Take 2 capsules (500 mg total) by mouth 2 (two) times daily. 07/21/23     ondansetron  (ZOFRAN -ODT) 4 MG disintegrating tablet Dissolve 1 tablet (4 mg total) by mouth every 8 (eight) hours as needed for nausea or vomiting. 10/01/23   Onita Duos, MD  predniSONE  (DELTASONE ) 5 MG tablet Take 1 tablet (5 mg total) by mouth daily. 03/28/24     propranolol  ER (INDERAL  LA) 60 MG 24 hr capsule Take 1 capsule (  60 mg total) by mouth daily. 02/02/24   Onita Duos, MD  sodium bicarbonate  650 MG tablet Take 2 tablets (1,300 mg total) by mouth 2 (two) times daily. 12/04/22     sodium bicarbonate  650 MG tablet Take 2 tablets (1,300 mg total) by mouth 2 (two) times daily. 02/25/24     Specialty Vitamins Products (MAGNESIUM, AMINO ACID CHELATE,) 133 MG tablet Take 1 tablet by mouth 2 times a day. 12/03/21     Specialty Vitamins Products (MG PLUS PROTEIN) 133 MG TABS Take 1 tablet by mouth 2 times a day. 10/13/21     Squaric Acid Dibutylester LIQD by Does not apply route.    [provider]  SUMAtriptan  (IMITREX ) 100 MG tablet Take 1 tablet (100 mg total) by mouth as needed for migraine. May repeat in 2 hours if headache persists  or recurs. 04/13/24   Whitfield Raisin, NP  tacrolimus  (PROGRAF ) 1 MG capsule Take 3 capsules (3 mg total) by mouth 2 (two) times daily. Patient taking differently: Take 3 mg by mouth at bedtime. 01/21/23     tacrolimus  (PROGRAF ) 1 MG capsule Take 4 capsules (4 mg total) by mouth daily AND 3 capsules (3 mg total) Nightly. 01/28/23     tiZANidine  (ZANAFLEX ) 4 MG tablet Take 1 tablet (4 mg total) by mouth every 6 (six) hours as needed for muscle spasms. 10/01/23   Onita Duos, MD    Family History Family History  Problem Relation Age of Onset   Arthritis Mother    Cancer Mother        breast cancer   Arthritis Father    Hypertension Father    Arthritis Maternal Grandmother    Hypertension Paternal Grandmother    Hypertension Paternal Grandfather    Diabetes Paternal Grandfather     Social History Social History   Tobacco Use   Smoking status: Never   Smokeless tobacco: Never  Substance Use Topics   Alcohol use: No    Alcohol/week: 0.0 standard drinks of alcohol   Drug use: No     Allergies   Sulfa antibiotics, Advil [ibuprofen], Dapsone, and Septra [sulfamethoxazole-trimethoprim]   Review of Systems Review of Systems  Constitutional:  Negative for chills and fever.  HENT:  Positive for congestion, postnasal drip, sinus pressure, sore throat and voice change. Negative for ear pain.   Respiratory:  Positive for cough. Negative for shortness of breath.      Physical Exam Triage Vital Signs ED Triage Vitals  Encounter Vitals Group     BP 04/19/24 0857 130/81     Girls Systolic BP Percentile --      Girls Diastolic BP Percentile --      Boys Systolic BP Percentile --      Boys Diastolic BP Percentile --      Pulse Rate 04/19/24 0857 85     Resp 04/19/24 0857 18     Temp 04/19/24 0857 99.4 F (37.4 C)     Temp src --      SpO2 04/19/24 0857 99 %     Weight --      Height --      Head Circumference --      Peak Flow --      Pain Score 04/19/24 0853 5     Pain Loc --       Pain Education --      Exclude from Growth Chart --    No data found.  Updated Vital Signs BP 130/81  Pulse 85   Temp 99.4 F (37.4 C)   Resp 18   SpO2 99%   Visual Acuity Right Eye Distance:   Left Eye Distance:   Bilateral Distance:    Right Eye Near:   Left Eye Near:    Bilateral Near:     Physical Exam Constitutional:      General: He is not in acute distress. HENT:     Right Ear: Tympanic membrane normal.     Left Ear: Tympanic membrane normal.     Nose: Congestion present.     Mouth/Throat:     Mouth: Mucous membranes are moist.     Pharynx: Posterior oropharyngeal erythema present.     Comments: PND  Cardiovascular:     Rate and Rhythm: Normal rate and regular rhythm.     Heart sounds: Normal heart sounds.  Pulmonary:     Effort: Pulmonary effort is normal. No respiratory distress.     Breath sounds: Normal breath sounds.   Neurological:     Mental Status: He is alert.      UC Treatments / Results  Labs (all labs ordered are listed, but only abnormal results are displayed) Labs Reviewed - No data to display  EKG   Radiology No results found.  Procedures Procedures (including critical care time)  Medications Ordered in UC Medications - No data to display  Initial Impression / Assessment and Plan / UC Course  I have reviewed the triage vital signs and the nursing notes.  Pertinent labs & imaging results that were available during my care of the patient were reviewed by me and considered in my medical decision making (see chart for details).    Recurrent sinusitis.  Afebrile and vital signs are stable.  Patient was last treated for sinus infection via telehealth in March with Augmentin .  His current symptoms have been ongoing for 10 days.  Treating today with doxycycline .  Education provided on sinus infection.  Instructed him to follow-up with his PCP as his symptoms are recurrent.  He agrees to plan of care.  Final Clinical  Impressions(s) / UC Diagnoses   Final diagnoses:  Acute recurrent maxillary sinusitis     Discharge Instructions      Take the doxycycline  as directed.  Follow up with your primary care provider.        ED Prescriptions     Medication Sig Dispense Auth. Provider   doxycycline  (VIBRAMYCIN ) 100 MG capsule Take 1 capsule (100 mg total) by mouth 2 (two) times daily for 7 days. 14 capsule Corlis Burnard DEL, NP      PDMP not reviewed this encounter.   Corlis Burnard DEL, NP 04/19/24 [phone]

## 2024-04-19 NOTE — ED Triage Notes (Signed)
 Patient to Urgent Care with complaints of drainage/ sore throat/ hoarseness/ productive cough with green mucus. Denies any known fevers.  Symptoms started approx ten days ago.   No otc meds.

## 2024-04-19 NOTE — Discharge Instructions (Addendum)
Take the doxycycline as directed.  Follow up with your primary care provider.

## 2024-04-25 DIAGNOSIS — Z940 Kidney transplant status: Principal | ICD-10-CM

## 2024-04-25 DIAGNOSIS — Z79899 Other long term (current) drug therapy: Principal | ICD-10-CM

## 2024-04-25 DIAGNOSIS — Z1159 Encounter for screening for other viral diseases: Principal | ICD-10-CM

## 2024-04-26 ENCOUNTER — Other Ambulatory Visit (HOSPITAL_COMMUNITY): Payer: Self-pay

## 2024-04-26 DIAGNOSIS — Z940 Kidney transplant status: Principal | ICD-10-CM

## 2024-04-26 MED ORDER — TACROLIMUS 1 MG CAPSULE, IMMEDIATE-RELEASE
ORAL_CAPSULE | 3 refills | 0.00000 days
Start: 2024-04-26 — End: ?

## 2024-04-27 ENCOUNTER — Other Ambulatory Visit: Payer: Self-pay

## 2024-04-27 ENCOUNTER — Other Ambulatory Visit (HOSPITAL_COMMUNITY): Payer: Self-pay

## 2024-04-27 MED ORDER — TACROLIMUS 1 MG PO CAPS
ORAL_CAPSULE | ORAL | 2 refills | Status: AC
  Filled 2024-04-27: qty 630, 90d supply, fill #0
  Filled 2024-07-25: qty 630, 90d supply, fill #1
  Filled 2024-10-24 – 2024-10-26 (×2): qty 630, 90d supply, fill #2

## 2024-04-27 MED ORDER — TACROLIMUS 1 MG CAPSULE, IMMEDIATE-RELEASE
ORAL_CAPSULE | ORAL | 3 refills | 90.00000 days | Status: CP
Start: 2024-04-27 — End: 2025-04-27

## 2024-05-22 DIAGNOSIS — Z940 Kidney transplant status: Principal | ICD-10-CM

## 2024-05-23 DIAGNOSIS — Z1159 Encounter for screening for other viral diseases: Principal | ICD-10-CM

## 2024-05-23 DIAGNOSIS — Z79899 Other long term (current) drug therapy: Principal | ICD-10-CM

## 2024-05-23 DIAGNOSIS — Z940 Kidney transplant status: Principal | ICD-10-CM

## 2024-05-24 ENCOUNTER — Other Ambulatory Visit (HOSPITAL_COMMUNITY): Payer: Self-pay

## 2024-06-07 ENCOUNTER — Telehealth

## 2024-06-07 ENCOUNTER — Other Ambulatory Visit: Payer: Self-pay | Admitting: Oncology

## 2024-06-07 MED ORDER — AMOXICILLIN-POT CLAVULANATE 875-125 MG PO TABS: 1.0000 | ORAL_TABLET | Freq: Two times a day (BID) | ORAL | 0 refills | Status: AC

## 2024-06-16 DIAGNOSIS — B079 Viral wart, unspecified: Principal | ICD-10-CM

## 2024-06-20 DIAGNOSIS — Z79899 Other long term (current) drug therapy: Principal | ICD-10-CM

## 2024-06-20 DIAGNOSIS — Z940 Kidney transplant status: Principal | ICD-10-CM

## 2024-06-20 DIAGNOSIS — Z1159 Encounter for screening for other viral diseases: Principal | ICD-10-CM

## 2024-06-21 ENCOUNTER — Other Ambulatory Visit (HOSPITAL_COMMUNITY): Payer: Self-pay

## 2024-06-21 ENCOUNTER — Other Ambulatory Visit: Payer: Self-pay

## 2024-07-08 ENCOUNTER — Other Ambulatory Visit (HOSPITAL_COMMUNITY): Payer: Self-pay

## 2024-07-11 ENCOUNTER — Other Ambulatory Visit: Payer: Self-pay

## 2024-07-12 ENCOUNTER — Other Ambulatory Visit (HOSPITAL_COMMUNITY): Payer: Self-pay

## 2024-07-12 DIAGNOSIS — Z940 Kidney transplant status: Principal | ICD-10-CM

## 2024-07-12 MED ORDER — MYCOPHENOLATE MOFETIL 250 MG CAPSULE
ORAL_CAPSULE | Freq: Two times a day (BID) | ORAL | 3 refills | 90.00000 days | Status: CP
Start: 2024-07-12 — End: ?

## 2024-07-13 ENCOUNTER — Other Ambulatory Visit (HOSPITAL_COMMUNITY): Payer: Self-pay

## 2024-07-13 ENCOUNTER — Other Ambulatory Visit: Payer: Self-pay

## 2024-07-13 MED ORDER — MYCOPHENOLATE MOFETIL 250 MG PO CAPS
500.0000 mg | ORAL_CAPSULE | Freq: Two times a day (BID) | ORAL | 3 refills | Status: AC
  Filled 2024-07-13: qty 360, 90d supply, fill #0
  Filled 2024-10-24 – 2024-10-26 (×3): qty 360, 90d supply, fill #1

## 2024-07-18 DIAGNOSIS — Z940 Kidney transplant status: Principal | ICD-10-CM

## 2024-07-18 DIAGNOSIS — Z79899 Other long term (current) drug therapy: Principal | ICD-10-CM

## 2024-07-18 DIAGNOSIS — Z1159 Encounter for screening for other viral diseases: Principal | ICD-10-CM

## 2024-07-25 ENCOUNTER — Other Ambulatory Visit (HOSPITAL_COMMUNITY): Payer: Self-pay

## 2024-07-28 DIAGNOSIS — B079 Viral wart, unspecified: Secondary | ICD-10-CM | POA: Diagnosis not present

## 2024-07-28 DIAGNOSIS — D84821 Immunocompromised state due to drug therapy (HHS-HCC): Principal | ICD-10-CM

## 2024-07-28 DIAGNOSIS — Z940 Kidney transplant status: Secondary | ICD-10-CM | POA: Diagnosis not present

## 2024-07-28 DIAGNOSIS — B078 Other viral warts: Principal | ICD-10-CM

## 2024-08-09 ENCOUNTER — Telehealth: Payer: Self-pay

## 2024-08-09 DIAGNOSIS — Z111 Encounter for screening for respiratory tuberculosis: Secondary | ICD-10-CM

## 2024-08-09 NOTE — Telephone Encounter (Signed)
 Medical Evaluation form dropped off to be processed Br [facility] [place] for for [facility] placed in box

## 2024-08-12 NOTE — Telephone Encounter (Signed)
 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

## 2024-08-15 DIAGNOSIS — Z79899 Other long term (current) drug therapy: Principal | ICD-10-CM

## 2024-08-15 DIAGNOSIS — Z1159 Encounter for screening for other viral diseases: Principal | ICD-10-CM

## 2024-08-15 DIAGNOSIS — Z940 Kidney transplant status: Principal | ICD-10-CM

## 2024-08-16 ENCOUNTER — Ambulatory Visit

## 2024-08-16 DIAGNOSIS — Z111 Encounter for screening for respiratory tuberculosis: Secondary | ICD-10-CM | POA: Diagnosis not present

## 2024-08-16 NOTE — Telephone Encounter (Signed)
 Form up front for pick up- patient is aware and having blood test for TB this week

## 2024-08-19 LAB — QUANTIFERON-TB GOLD PLUS
QuantiFERON Mitogen Value: 0.14 [IU]/mL
QuantiFERON Nil Value: 0.01 [IU]/mL
QuantiFERON TB1 Ag Value: 0.02 [IU]/mL
QuantiFERON TB2 Ag Value: 0.02 [IU]/mL
QuantiFERON-TB Gold Plus: UNDETERMINED — AB

## 2024-08-23 ENCOUNTER — Other Ambulatory Visit (HOSPITAL_COMMUNITY): Payer: Self-pay

## 2024-08-23 ENCOUNTER — Other Ambulatory Visit: Payer: Self-pay | Admitting: Oncology

## 2024-08-23 MED ORDER — ALBUTEROL SULFATE HFA 108 (90 BASE) MCG/ACT IN AERS: 1.0000 | INHALATION_SPRAY | RESPIRATORY_TRACT | 2 refills | Status: AC | PRN

## 2024-08-23 MED ORDER — DOXYCYCLINE HYCLATE 100 MG PO TABS: 100.0000 mg | ORAL_TABLET | Freq: Two times a day (BID) | ORAL | 0 refills | Status: AC

## 2024-08-28 DIAGNOSIS — Z79899 Other long term (current) drug therapy: Principal | ICD-10-CM

## 2024-08-28 DIAGNOSIS — Z1159 Encounter for screening for other viral diseases: Principal | ICD-10-CM

## 2024-08-28 DIAGNOSIS — Z940 Kidney transplant status: Principal | ICD-10-CM

## 2024-08-28 DIAGNOSIS — D631 Anemia in chronic kidney disease: Principal | ICD-10-CM

## 2024-08-28 DIAGNOSIS — N1832 Stage 3b chronic kidney disease (CKD) (CMS-HCC): Principal | ICD-10-CM

## 2024-08-28 DIAGNOSIS — N189 Chronic kidney disease, unspecified: Principal | ICD-10-CM

## 2024-08-29 ENCOUNTER — Encounter: Payer: Self-pay | Admitting: Family Medicine

## 2024-08-30 ENCOUNTER — Ambulatory Visit: Payer: Self-pay | Admitting: Family Medicine

## 2024-09-01 DIAGNOSIS — Z940 Kidney transplant status: Principal | ICD-10-CM

## 2024-09-01 DIAGNOSIS — Z1159 Encounter for screening for other viral diseases: Principal | ICD-10-CM

## 2024-09-01 DIAGNOSIS — Z79899 Other long term (current) drug therapy: Principal | ICD-10-CM

## 2024-09-02 ENCOUNTER — Ambulatory Visit
Admit: 2024-09-02 | Discharge: 2024-09-02 | Payer: PRIVATE HEALTH INSURANCE | Attending: Nephrology | Primary: Nephrology

## 2024-09-02 ENCOUNTER — Ambulatory Visit: Admit: 2024-09-02 | Discharge: 2024-09-02 | Payer: PRIVATE HEALTH INSURANCE

## 2024-09-02 ENCOUNTER — Other Ambulatory Visit (HOSPITAL_COMMUNITY): Payer: Self-pay

## 2024-09-02 DIAGNOSIS — D631 Anemia in chronic kidney disease: Secondary | ICD-10-CM | POA: Diagnosis not present

## 2024-09-02 DIAGNOSIS — N1832 Stage 3b chronic kidney disease (CKD) (CMS-HCC): Principal | ICD-10-CM

## 2024-09-02 DIAGNOSIS — I129 Hypertensive chronic kidney disease with stage 1 through stage 4 chronic kidney disease, or unspecified chronic kidney disease: Secondary | ICD-10-CM | POA: Diagnosis not present

## 2024-09-02 DIAGNOSIS — Z1159 Encounter for screening for other viral diseases: Secondary | ICD-10-CM | POA: Diagnosis not present

## 2024-09-02 DIAGNOSIS — Z79899 Other long term (current) drug therapy: Secondary | ICD-10-CM | POA: Diagnosis not present

## 2024-09-02 DIAGNOSIS — R053 Chronic cough: Secondary | ICD-10-CM | POA: Diagnosis not present

## 2024-09-02 DIAGNOSIS — E7204 Cystinosis: Secondary | ICD-10-CM | POA: Diagnosis not present

## 2024-09-02 DIAGNOSIS — Z940 Kidney transplant status: Secondary | ICD-10-CM | POA: Diagnosis not present

## 2024-09-02 DIAGNOSIS — Z7952 Long term (current) use of systemic steroids: Secondary | ICD-10-CM | POA: Diagnosis not present

## 2024-09-02 DIAGNOSIS — E039 Hypothyroidism, unspecified: Secondary | ICD-10-CM | POA: Diagnosis not present

## 2024-09-02 DIAGNOSIS — N189 Chronic kidney disease, unspecified: Principal | ICD-10-CM

## 2024-09-02 MED ORDER — LEVOTHYROXINE SODIUM 50 MCG PO TABS: 50.0000 ug | ORAL_TABLET | Freq: Every evening | ORAL | 3 refills | Status: AC

## 2024-09-02 MED ORDER — AMLODIPINE BESYLATE 5 MG PO TABS
5.0000 mg | ORAL_TABLET | Freq: Every day | ORAL | 3 refills | Status: AC
  Filled 2024-09-02 – 2024-09-27 (×2): qty 90, 90d supply, fill #0

## 2024-09-02 MED ORDER — AMLODIPINE 5 MG TABLET
ORAL_TABLET | Freq: Every day | ORAL | 3 refills | 90.00000 days | Status: CP
Start: 2024-09-02 — End: ?

## 2024-09-02 MED ORDER — AMOXICILLIN 500 MG-POTASSIUM CLAVULANATE 125 MG TABLET
ORAL_TABLET | Freq: Two times a day (BID) | ORAL | 0 refills | 21.00000 days | Status: CP
Start: 2024-09-02 — End: 2024-09-23

## 2024-09-02 MED ORDER — LEVOTHYROXINE 50 MCG TABLET
ORAL_TABLET | Freq: Every evening | ORAL | 3 refills | 90.00000 days | Status: CP
Start: 2024-09-02 — End: ?

## 2024-09-05 ENCOUNTER — Other Ambulatory Visit (HOSPITAL_COMMUNITY): Payer: Self-pay

## 2024-09-12 ENCOUNTER — Inpatient Hospital Stay: Admit: 2024-09-12 | Discharge: 2024-09-12 | Payer: PRIVATE HEALTH INSURANCE

## 2024-09-12 DIAGNOSIS — J189 Pneumonia, unspecified organism: Secondary | ICD-10-CM | POA: Diagnosis not present

## 2024-09-12 DIAGNOSIS — R053 Chronic cough: Secondary | ICD-10-CM | POA: Diagnosis not present

## 2024-09-12 DIAGNOSIS — Z940 Kidney transplant status: Principal | ICD-10-CM

## 2024-09-12 DIAGNOSIS — Z1159 Encounter for screening for other viral diseases: Principal | ICD-10-CM

## 2024-09-12 DIAGNOSIS — Z79899 Other long term (current) drug therapy: Principal | ICD-10-CM

## 2024-09-14 DIAGNOSIS — Z940 Kidney transplant status: Principal | ICD-10-CM

## 2024-09-15 DIAGNOSIS — E611 Iron deficiency: Principal | ICD-10-CM

## 2024-09-15 DIAGNOSIS — E7204 Cystinosis: Principal | ICD-10-CM

## 2024-09-15 DIAGNOSIS — Z940 Kidney transplant status: Principal | ICD-10-CM

## 2024-09-15 MED ORDER — CYSTEAMINE HCL 0.37 % EYE DROPS
Freq: Four times a day (QID) | OPHTHALMIC | 11 refills | 0.00000 days | Status: CP
Start: 2024-09-15 — End: ?

## 2024-09-19 DIAGNOSIS — E7204 Cystinosis: Principal | ICD-10-CM

## 2024-09-19 DIAGNOSIS — Z940 Kidney transplant status: Principal | ICD-10-CM

## 2024-09-19 MED ORDER — CYSTADROPS 0.37 % EYE DROPS
OPHTHALMIC | 1 refills | 0.00000 days | Status: CP
Start: 2024-09-19 — End: ?

## 2024-09-19 MED ORDER — CYSTEAMINE HCL 0.37 % EYE DROPS
Freq: Four times a day (QID) | OPHTHALMIC | 11 refills | 0.00000 days | Status: CP
Start: 2024-09-19 — End: 2024-09-19

## 2024-09-20 DIAGNOSIS — Z940 Kidney transplant status: Principal | ICD-10-CM

## 2024-09-27 ENCOUNTER — Other Ambulatory Visit (HOSPITAL_COMMUNITY): Payer: Self-pay

## 2024-09-27 ENCOUNTER — Other Ambulatory Visit: Payer: Self-pay

## 2024-09-29 DIAGNOSIS — Z940 Kidney transplant status: Secondary | ICD-10-CM | POA: Diagnosis not present

## 2024-09-29 DIAGNOSIS — D84821 Immunocompromised state due to drug therapy (HHS-HCC): Principal | ICD-10-CM

## 2024-09-29 DIAGNOSIS — N1831 Chronic kidney disease, stage 3a: Secondary | ICD-10-CM | POA: Diagnosis not present

## 2024-09-29 DIAGNOSIS — I129 Hypertensive chronic kidney disease with stage 1 through stage 4 chronic kidney disease, or unspecified chronic kidney disease: Secondary | ICD-10-CM | POA: Diagnosis not present

## 2024-09-29 DIAGNOSIS — E872 Acidosis, unspecified: Secondary | ICD-10-CM | POA: Diagnosis not present

## 2024-09-29 DIAGNOSIS — R52 Pain, unspecified: Secondary | ICD-10-CM | POA: Diagnosis not present

## 2024-09-29 DIAGNOSIS — R509 Fever, unspecified: Secondary | ICD-10-CM | POA: Diagnosis not present

## 2024-09-29 DIAGNOSIS — E876 Hypokalemia: Secondary | ICD-10-CM | POA: Diagnosis not present

## 2024-09-29 DIAGNOSIS — D631 Anemia in chronic kidney disease: Secondary | ICD-10-CM | POA: Diagnosis not present

## 2024-09-29 DIAGNOSIS — E7204 Cystinosis: Secondary | ICD-10-CM | POA: Diagnosis not present

## 2024-09-29 DIAGNOSIS — E039 Hypothyroidism, unspecified: Secondary | ICD-10-CM | POA: Diagnosis not present

## 2024-09-29 DIAGNOSIS — B078 Other viral warts: Principal | ICD-10-CM

## 2024-09-30 ENCOUNTER — Other Ambulatory Visit (HOSPITAL_COMMUNITY): Payer: Self-pay

## 2024-09-30 DIAGNOSIS — E872 Acidosis, unspecified: Secondary | ICD-10-CM | POA: Diagnosis not present

## 2024-09-30 DIAGNOSIS — E876 Hypokalemia: Secondary | ICD-10-CM | POA: Diagnosis not present

## 2024-09-30 DIAGNOSIS — E039 Hypothyroidism, unspecified: Secondary | ICD-10-CM | POA: Diagnosis not present

## 2024-09-30 DIAGNOSIS — N1831 Chronic kidney disease, stage 3a: Secondary | ICD-10-CM | POA: Diagnosis not present

## 2024-09-30 DIAGNOSIS — I129 Hypertensive chronic kidney disease with stage 1 through stage 4 chronic kidney disease, or unspecified chronic kidney disease: Secondary | ICD-10-CM | POA: Diagnosis not present

## 2024-09-30 DIAGNOSIS — R509 Fever, unspecified: Secondary | ICD-10-CM | POA: Diagnosis not present

## 2024-10-03 ENCOUNTER — Telehealth: Payer: Self-pay

## 2024-10-03 NOTE — Transitions of Care (Post Inpatient/ED Visit) (Signed)
   10/03/2024  Name: Joshua Atkinson MRN: [id number] DOB: 02/05/1991  Today's TOC FU Call Status: Today's TOC FU Call Status:: Unsuccessful Call (1st Attempt) Unsuccessful Call (1st Attempt) Date: 10/03/24  Attempted to reach the patient regarding the most recent Inpatient/ED visit.  Follow Up Plan: Additional outreach attempts will be made to reach the patient to complete the Transitions of Care (Post Inpatient/ED visit) call.   Signature  Charmaine Bloodgood, LPN [facility] Health Advisor [facility] You Are. We Are. [facility] Direct Dial [phone]

## 2024-10-04 ENCOUNTER — Other Ambulatory Visit (HOSPITAL_COMMUNITY): Payer: Self-pay

## 2024-10-05 ENCOUNTER — Other Ambulatory Visit (HOSPITAL_COMMUNITY): Payer: Self-pay

## 2024-10-07 DIAGNOSIS — Z940 Kidney transplant status: Principal | ICD-10-CM

## 2024-10-10 DIAGNOSIS — Z940 Kidney transplant status: Principal | ICD-10-CM

## 2024-10-10 DIAGNOSIS — Z1159 Encounter for screening for other viral diseases: Principal | ICD-10-CM

## 2024-10-10 DIAGNOSIS — Z79899 Other long term (current) drug therapy: Principal | ICD-10-CM

## 2024-10-18 ENCOUNTER — Ambulatory Visit: Admit: 2024-10-18 | Discharge: 2024-10-19 | Payer: PRIVATE HEALTH INSURANCE

## 2024-10-18 DIAGNOSIS — E611 Iron deficiency: Secondary | ICD-10-CM | POA: Diagnosis not present

## 2024-10-18 DIAGNOSIS — Z1159 Encounter for screening for other viral diseases: Secondary | ICD-10-CM | POA: Diagnosis not present

## 2024-10-18 DIAGNOSIS — Z79899 Other long term (current) drug therapy: Secondary | ICD-10-CM | POA: Diagnosis not present

## 2024-10-18 DIAGNOSIS — Z940 Kidney transplant status: Secondary | ICD-10-CM | POA: Diagnosis not present

## 2024-10-19 DIAGNOSIS — Z940 Kidney transplant status: Principal | ICD-10-CM

## 2024-10-24 ENCOUNTER — Other Ambulatory Visit (HOSPITAL_COMMUNITY): Payer: Self-pay

## 2024-10-26 ENCOUNTER — Other Ambulatory Visit: Payer: Self-pay

## 2024-10-26 ENCOUNTER — Other Ambulatory Visit (HOSPITAL_COMMUNITY): Payer: Self-pay

## 2024-11-07 DIAGNOSIS — Z1159 Encounter for screening for other viral diseases: Principal | ICD-10-CM

## 2024-11-07 DIAGNOSIS — E7204 Cystinosis: Principal | ICD-10-CM

## 2024-11-07 DIAGNOSIS — Z79899 Other long term (current) drug therapy: Principal | ICD-10-CM

## 2024-11-07 DIAGNOSIS — Z940 Kidney transplant status: Principal | ICD-10-CM

## 2024-11-08 ENCOUNTER — Encounter: Payer: Self-pay | Admitting: Pharmacist

## 2024-11-08 ENCOUNTER — Other Ambulatory Visit: Payer: Self-pay

## 2024-11-08 ENCOUNTER — Other Ambulatory Visit (HOSPITAL_COMMUNITY): Payer: Self-pay

## 2024-11-08 MED ORDER — FLUOROMETHOLONE 0.1 % OP SUSP
1.0000 [drp] | Freq: Two times a day (BID) | OPHTHALMIC | 1 refills | Status: AC
  Filled 2024-11-08: qty 5, 25d supply, fill #0
  Filled 2024-11-08: qty 10, 50d supply, fill #0

## 2024-11-08 MED ORDER — FLUOROMETHOLONE 0.1 % EYE DROPS,SUSPENSION
Freq: Two times a day (BID) | OPHTHALMIC | 1 refills | 0.00000 days | Status: CP
Start: 2024-11-08 — End: 2024-11-08

## 2024-11-10 DIAGNOSIS — D84821 Immunocompromised state due to drug therapy (HHS-HCC): Principal | ICD-10-CM

## 2024-11-10 DIAGNOSIS — B078 Other viral warts: Principal | ICD-10-CM

## 2024-11-10 DIAGNOSIS — Z940 Kidney transplant status: Principal | ICD-10-CM

## 2024-11-14 ENCOUNTER — Other Ambulatory Visit: Payer: Self-pay

## 2024-11-14 DIAGNOSIS — E7204 Cystinosis: Principal | ICD-10-CM

## 2024-11-14 MED ORDER — CYSTAGON 150 MG CAPSULE
ORAL_CAPSULE | 7 refills | 0.00000 days
Start: 2024-11-14 — End: ?

## 2024-11-15 DIAGNOSIS — E7204 Cystinosis: Principal | ICD-10-CM

## 2024-11-15 MED ORDER — CYSTADROPS 0.37 % EYE DROPS
10 refills | 0.00000 days
Start: 2024-11-15 — End: ?

## 2024-11-15 MED ORDER — CYSTAGON 150 MG CAPSULE
ORAL_CAPSULE | 7 refills | 0.00000 days | Status: CP
Start: 2024-11-15 — End: ?

## 2024-11-16 MED ORDER — CYSTADROPS 0.37 % EYE DROPS
10 refills | 0.00000 days | Status: CP
Start: 2024-11-16 — End: ?

## 2024-11-17 DIAGNOSIS — D849 Immunodeficiency, unspecified: Principal | ICD-10-CM

## 2024-11-17 DIAGNOSIS — E038 Other specified hypothyroidism: Principal | ICD-10-CM

## 2024-11-17 DIAGNOSIS — I10 Essential (primary) hypertension: Principal | ICD-10-CM

## 2024-11-17 DIAGNOSIS — Z940 Kidney transplant status: Principal | ICD-10-CM

## 2024-11-17 DIAGNOSIS — G43909 Migraine, unspecified, not intractable, without status migrainosus: Principal | ICD-10-CM

## 2024-11-22 DIAGNOSIS — E7204 Cystinosis: Secondary | ICD-10-CM

## 2024-11-22 DIAGNOSIS — Z940 Kidney transplant status: Principal | ICD-10-CM

## 2024-11-22 MED ORDER — CYSTADROPS 0.37 % EYE DROPS
OPHTHALMIC | 10 refills | 0.00000 days | Status: CP
Start: 2024-11-22 — End: ?

## 2025-01-11 ENCOUNTER — Ambulatory Visit: Admitting: Adult Health
